# Patient Record
Sex: Female | Born: 1985 | Race: Black or African American | Hispanic: No | Marital: Single | Smoking: Former smoker
Health system: Southern US, Community
[De-identification: ages and names within clinical notes are randomized; demographics above are authoritative.]

## PROBLEM LIST (undated history)

## (undated) DIAGNOSIS — E669 Obesity, unspecified: Secondary | ICD-10-CM

---

## 2006-02-07 ENCOUNTER — Emergency Department (HOSPITAL_COMMUNITY): Admission: EM | Admit: 2006-02-07 | Discharge: 2006-02-07 | Payer: Self-pay | Admitting: Emergency Medicine

## 2006-07-15 ENCOUNTER — Emergency Department (HOSPITAL_COMMUNITY): Admission: EM | Admit: 2006-07-15 | Discharge: 2006-07-15 | Payer: Self-pay | Admitting: Emergency Medicine

## 2006-09-01 ENCOUNTER — Emergency Department (HOSPITAL_COMMUNITY): Admission: EM | Admit: 2006-09-01 | Discharge: 2006-09-01 | Payer: Self-pay | Admitting: Emergency Medicine

## 2007-01-08 ENCOUNTER — Emergency Department (HOSPITAL_COMMUNITY): Admission: EM | Admit: 2007-01-08 | Discharge: 2007-01-08 | Payer: Self-pay | Admitting: Emergency Medicine

## 2007-01-29 ENCOUNTER — Emergency Department (HOSPITAL_COMMUNITY): Admission: EM | Admit: 2007-01-29 | Discharge: 2007-01-30 | Payer: Self-pay | Admitting: Emergency Medicine

## 2007-04-13 HISTORY — PX: CHOLECYSTECTOMY: SHX55

## 2007-07-24 ENCOUNTER — Emergency Department (HOSPITAL_COMMUNITY): Admission: EM | Admit: 2007-07-24 | Discharge: 2007-07-24 | Payer: Self-pay | Admitting: Emergency Medicine

## 2007-07-25 ENCOUNTER — Emergency Department (HOSPITAL_COMMUNITY): Admission: EM | Admit: 2007-07-25 | Discharge: 2007-07-25 | Payer: Self-pay | Admitting: Emergency Medicine

## 2008-03-20 ENCOUNTER — Emergency Department (HOSPITAL_COMMUNITY): Admission: EM | Admit: 2008-03-20 | Discharge: 2008-03-20 | Payer: Self-pay | Admitting: Emergency Medicine

## 2008-03-23 ENCOUNTER — Observation Stay (HOSPITAL_COMMUNITY): Admission: EM | Admit: 2008-03-23 | Discharge: 2008-03-24 | Payer: Self-pay | Admitting: Emergency Medicine

## 2008-03-23 ENCOUNTER — Encounter (INDEPENDENT_AMBULATORY_CARE_PROVIDER_SITE_OTHER): Payer: Self-pay | Admitting: Surgery

## 2010-04-03 ENCOUNTER — Emergency Department (HOSPITAL_COMMUNITY)
Admission: EM | Admit: 2010-04-03 | Discharge: 2010-04-03 | Payer: Self-pay | Source: Home / Self Care | Admitting: Emergency Medicine

## 2010-04-17 ENCOUNTER — Emergency Department (HOSPITAL_COMMUNITY)
Admission: EM | Admit: 2010-04-17 | Discharge: 2010-04-17 | Payer: Self-pay | Source: Home / Self Care | Admitting: Family Medicine

## 2010-08-25 NOTE — H&P (Signed)
Diane Wong, Diane Wong               ACCOUNT NO.:  [id number]   MEDICAL RECORD NO.:  [id number]          PATIENT TYPE:  INP   LOCATION:  5128                         FACILITY:  [facility]   PHYSICIAN:  Wilmon Arms. Corliss Skains, M.D. DATE OF BIRTH:  1985/08/11   DATE OF ADMISSION:  03/23/2008  DATE OF DISCHARGE:                              HISTORY & PHYSICAL   CHIEF COMPLAINT:  Recurrent right upper quadrant pain.   HISTORY OF PRESENT ILLNESS:  The patient is a 25 year old female who  presents with a 3-week history of episodes of right upper quadrant pain  associated with nausea, vomiting, and diarrhea.  This tends to be a  postprandial.  She had a severe attack on March 20, 2008, and was  evaluated in the emergency department.  At that time, she was noted to  have a total bilirubin level of 1.9.  Her white count was not obtained  at that time.  An ultrasound at that time showed cholelithiasis, but no  sign of cholecystitis.  Common bile duct was normal in appearance.  She  was discharged and was given an appointment for evaluation with [facility].  However, the patient had recurrence of her symptoms  last night.  These persisted throughout the day, so she came to the  emergency department today for evaluation.   PAST MEDICAL HISTORY:  1. Asthma.  2. Allergic rhinitis.   PAST SURGICAL HISTORY:  None.   FAMILY HISTORY:  Noncontributory.   SOCIAL HISTORY:  Nonsmoker and nondrinker.   ALLERGIES:  No drug allergies.   MEDICATIONS:  Albuterol MDI   PHYSICAL EXAMINATION:  VITAL SIGNS:  Temperature 97.5, pulse 82,  respirations 18, and blood pressure 137/88.  GENERAL:  This is a well-developed and well-nourished female, in no  apparent distress.  HEENT:  EOMI.  Sclerae anicteric.  NECK:  No mass or thyromegaly.  LUNGS:  Clear.  Normal respiratory effort.  HEART:  Regular rate and rhythm.  No murmur.  ABDOMEN:  Positive bowel sounds, tender in the right upper quadrant.  No  rebound.  No masses.  EXTREMITIES:  No edema.  SKIN:  Warm and dry with no sign of jaundice.   LABORATORIES:  Urine pregnancy is negative.  Lipase is 24.  White count  5.2 and hemoglobin 13.1.  Total bili 0.7, alk phos 119, AST 40, and ALT  162.  The patient did not have an ultrasound today.   IMPRESSION:  1. Biliary colic.  2. Possible early acute cholecystitis.   PLAN:  We will admit for IV antibiotics, IV hydration, and laparoscopic  cholecystectomy this admission.      Wilmon Arms. Tsuei, M.D.  Electronically Signed     MKT/MEDQ  D:  03/23/2008  T:  03/24/2008  Job:  [id number]

## 2010-08-25 NOTE — Op Note (Signed)
NAMEGINELLE, BAYS               ACCOUNT NO.:  [id number]   MEDICAL RECORD NO.:  [id number]          PATIENT TYPE:  INP   LOCATION:  5128                         FACILITY:  [facility]   PHYSICIAN:  Wilmon Arms. Corliss Skains, M.D. DATE OF BIRTH:  03/17/1986   DATE OF PROCEDURE:  03/23/2008  DATE OF DISCHARGE:                               OPERATIVE REPORT   PREOPERATIVE DIAGNOSES:  1. Biliary colic.  2. Early acute appendicitis.   POSTOPERATIVE DIAGNOSES:  1. Biliary colic.  2. Early acute appendicitis.   PROCEDURE PERFORMED:  Laparoscopic cholecystectomy with intraoperative  cholangiogram.   SURGEON:  Wilmon Arms. Tsuei, MD   ANESTHESIA:  General endotracheal.   INDICATIONS:  The patient is a 25 year old female who presents with a 3-  week history of recurrent episodes of right upper quadrant pain, nausea,  vomiting, and diarrhea.  She was evaluated early in the week in the  emergency department, was noted to have a symptomatic cholelithiasis  with a mildly elevated bilirubin of 1.9.  The patient was discharged  home with a followup appointment with Surgery, but last night began  having more severe symptoms.  This persisted throughout the day, so she  presented to the emergency department for evaluation.  She was noted to  have a normal white count and her liver function tests were normal.  Due  to her recurrent symptoms, the decision was made to proceed to the  operating room for laparoscopic cholecystectomy.   DESCRIPTION OF PROCEDURE:  The patient was brought to the operating  room, placed in a supine position on the operating room table.  After  adequate level of general anesthesia was obtained, the patient's abdomen  was prepped with Betadine and draped in a sterile fashion.  A time-out  was taken to assure the proper patient, the proper procedure.  The area  below her umbilicus was infiltrated with Marcaine and a transverse  incision was made.  Dissection was carried down through a  considerable  amount of adipose tissue to the fascia.  The fascia was grasped with  Kocher clamps and opened vertically.  We bluntly dissected into the  peritoneal cavity.  A stay suture of 0 Vicryl was placed around the  fascial opening.  A Hasson cannula was inserted and secured with a stay  suture.  Pneumoperitoneum was obtained by insufflating CO2 and  maintaining maximal pressure of 15 mmHg.  The laparoscope was inserted,  and the patient was positioned in reverse Trendelenburg, tilted to left.  We switched to a 30-degree, 10-mm scope due to the patient's size.  The  liver edge was elevated and we identified the gallbladder.  This  appeared mildly inflamed but did not appear distended or particularly  thickened.  This gallbladder was grasped with a clamp and elevated.  We  then dissected a lot of omental adhesions away from the surface of the  gallbladder.  We exposed the hilum of the gallbladder.  I was able to  bluntly dissect around the cystic duct.  It was ligated with a clip  distally.  A small opening was created on the  cystic duct.  A Cook  cholangiogram catheter was brought through the stab incision and  threaded into the cystic duct.  It was secured with a clip.  A  cholangiogram was then obtained, which showed good flow proximally and  distally in biliary tree with no sign of filling defects or obstruction.  Contrast flowed quickly and easily into the duodenum.  The patient has a  very long cystic duct.  The catheter was removed, and the cystic duct  was ligated with 3 clips and divided.  The cystic artery was ligated  with clips and divided.  Cautery was then used to remove the gallbladder  from the liver bed.  The gallbladder was placed in EndoCatch sac and  removed through the umbilical port site.  We re-inspected the  gallbladder fossa for hemostasis.  We irrigated the right upper  quadrant, suctioned as much as possible.  Pneumoperitoneum was then  released, as we  removed the trocars.  The pursestring sutures were used  to close the umbilical fascia.  A 4-0 Monocryl was used to close the  skin incisions.  Steri-Strips and clean dressings were applied.  The  patient was extubated and brought to the recovery room in stable  condition.  All sponge, instrument, and needle counts were correct.      Wilmon Arms. Tsuei, M.D.  Electronically Signed     MKT/MEDQ  D:  03/23/2008  T:  03/24/2008  Job:  [id number]

## 2010-10-06 ENCOUNTER — Emergency Department (HOSPITAL_COMMUNITY)
Admission: EM | Admit: 2010-10-06 | Discharge: 2010-10-06 | Disposition: A | Discharge status: Home / Self Care | Payer: Self-pay | Attending: Emergency Medicine | Admitting: Emergency Medicine

## 2010-10-06 ENCOUNTER — Emergency Department (HOSPITAL_COMMUNITY): Payer: Self-pay

## 2010-10-06 DIAGNOSIS — R0789 Other chest pain: Secondary | ICD-10-CM | POA: Insufficient documentation

## 2010-10-06 DIAGNOSIS — J40 Bronchitis, not specified as acute or chronic: Secondary | ICD-10-CM | POA: Insufficient documentation

## 2010-10-06 DIAGNOSIS — J309 Allergic rhinitis, unspecified: Secondary | ICD-10-CM | POA: Insufficient documentation

## 2010-10-06 DIAGNOSIS — J45909 Unspecified asthma, uncomplicated: Secondary | ICD-10-CM | POA: Insufficient documentation

## 2010-10-06 DIAGNOSIS — R0602 Shortness of breath: Secondary | ICD-10-CM | POA: Insufficient documentation

## 2010-10-06 LAB — DIFFERENTIAL
Eosinophils Relative: 2 % (ref 0–5)
Lymphocytes Relative: 44 % (ref 12–46)
Lymphs Abs: 3.1 10*3/uL (ref 0.7–4.0)
Neutro Abs: 3.3 10*3/uL (ref 1.7–7.7)
Neutrophils Relative %: 46 % (ref 43–77)

## 2010-10-06 LAB — CBC
HCT: 36.6 % (ref 36.0–46.0)
Hemoglobin: 11.8 g/dL — ABNORMAL LOW (ref 12.0–15.0)
MCHC: 32.2 g/dL (ref 30.0–36.0)
MCV: 92.4 fL (ref 78.0–100.0)
Platelets: 282 10*3/uL (ref 150–400)
RBC: 3.96 MIL/uL (ref 3.87–5.11)
RDW: 13 % (ref 11.5–15.5)
WBC: 7.1 10*3/uL (ref 4.0–10.5)

## 2010-10-06 LAB — BASIC METABOLIC PANEL
Calcium: 9.4 mg/dL (ref 8.4–10.5)
GFR calc Af Amer: >60 mL/min (ref >60)
GFR calc non Af Amer: >60 mL/min (ref >60)
Potassium: 3.9 mEq/L (ref 3.5–5.1)
Sodium: 138 mEq/L (ref 135–145)

## 2010-10-06 LAB — D-DIMER, QUANTITATIVE: D-Dimer, Quant: 0.29 ug/mL-FEU (ref 0.00–0.48)

## 2010-11-22 ENCOUNTER — Inpatient Hospital Stay (INDEPENDENT_AMBULATORY_CARE_PROVIDER_SITE_OTHER)
Admission: RE | Admit: 2010-11-22 | Discharge: 2010-11-22 | Disposition: A | Discharge status: Home / Self Care | Payer: Self-pay | Source: Ambulatory Visit | Attending: Family Medicine | Admitting: Family Medicine

## 2010-11-22 DIAGNOSIS — R51 Headache: Secondary | ICD-10-CM

## 2010-11-22 DIAGNOSIS — J309 Allergic rhinitis, unspecified: Secondary | ICD-10-CM

## 2010-12-29 ENCOUNTER — Emergency Department (HOSPITAL_COMMUNITY)
Admission: EM | Admit: 2010-12-29 | Discharge: 2010-12-29 | Disposition: A | Discharge status: Home / Self Care | Payer: Self-pay | Attending: Emergency Medicine | Admitting: Emergency Medicine

## 2010-12-29 DIAGNOSIS — R21 Rash and other nonspecific skin eruption: Secondary | ICD-10-CM | POA: Insufficient documentation

## 2011-01-05 LAB — RAPID STREP SCREEN (MED CTR MEBANE ONLY): Streptococcus, Group A Screen (Direct): POSITIVE — AB

## 2011-01-15 LAB — COMPREHENSIVE METABOLIC PANEL
ALT: 162 U/L — ABNORMAL HIGH (ref 0–35)
ALT: 228 U/L — ABNORMAL HIGH (ref 0–35)
Albumin: 3.7 g/dL (ref 3.5–5.2)
Alkaline Phosphatase: 108 U/L (ref 39–117)
Alkaline Phosphatase: 119 U/L — ABNORMAL HIGH (ref 39–117)
BUN: 5 mg/dL — ABNORMAL LOW (ref 6–23)
CO2: 23 mEq/L (ref 19–32)
Calcium: 9 mg/dL (ref 8.4–10.5)
GFR calc non Af Amer: >60 mL/min (ref >60)
Glucose, Bld: 102 mg/dL — ABNORMAL HIGH (ref 70–99)
Potassium: 4.2 mEq/L (ref 3.5–5.1)
Sodium: 134 mEq/L — ABNORMAL LOW (ref 135–145)
Sodium: 139 mEq/L (ref 135–145)
Total Protein: 6.7 g/dL (ref 6.0–8.3)

## 2011-01-15 LAB — URINALYSIS, ROUTINE W REFLEX MICROSCOPIC
Glucose, UA: NEGATIVE mg/dL
Hgb urine dipstick: NEGATIVE
Hgb urine dipstick: NEGATIVE
Ketones, ur: 15 mg/dL — AB
Ketones, ur: NEGATIVE mg/dL
Protein, ur: NEGATIVE mg/dL
Specific Gravity, Urine: 1.025 (ref 1.005–1.030)
Urobilinogen, UA: 1 mg/dL (ref 0.0–1.0)

## 2011-01-15 LAB — CBC
Hemoglobin: 13.1 g/dL (ref 12.0–15.0)
Platelets: 317 10*3/uL (ref 150–400)
RDW: 13.6 % (ref 11.5–15.5)

## 2011-01-15 LAB — DIFFERENTIAL
Basophils Relative: 1 % (ref 0–1)
Eosinophils Absolute: 0.5 10*3/uL (ref 0.0–0.7)
Monocytes Absolute: 0.4 10*3/uL (ref 0.1–1.0)
Monocytes Relative: 7 % (ref 3–12)

## 2011-01-15 LAB — POCT PREGNANCY, URINE: Preg Test, Ur: NEGATIVE

## 2011-01-15 LAB — LIPASE, BLOOD: Lipase: 26 U/L (ref 11–59)

## 2011-01-20 LAB — BASIC METABOLIC PANEL
CO2: 27
Calcium: 8.7
Creatinine, Ser: 0.77
GFR calc Af Amer: >60
GFR calc non Af Amer: >60
Sodium: 135

## 2011-01-20 LAB — DIFFERENTIAL
Lymphocytes Relative: 34
Lymphs Abs: 2.7
Monocytes Absolute: 0.7
Monocytes Relative: 9
Neutro Abs: 4.2
Neutrophils Relative %: 54

## 2011-01-20 LAB — CBC
Hemoglobin: 12.4
MCHC: 33.9
RBC: 4.09
WBC: 7.9

## 2011-01-20 LAB — HEMOGLOBIN AND HEMATOCRIT, BLOOD
HCT: 35.6 — ABNORMAL LOW
Hemoglobin: 12.2

## 2011-01-20 LAB — PREGNANCY, URINE: Preg Test, Ur: NEGATIVE

## 2011-01-20 LAB — URINALYSIS, ROUTINE W REFLEX MICROSCOPIC
Bilirubin Urine: NEGATIVE
Nitrite: NEGATIVE
Specific Gravity, Urine: 1.022
pH: 7.5

## 2011-01-21 LAB — RAPID STREP SCREEN (MED CTR MEBANE ONLY): Streptococcus, Group A Screen (Direct): NEGATIVE

## 2011-05-04 ENCOUNTER — Encounter (HOSPITAL_COMMUNITY): Payer: Self-pay | Admitting: Emergency Medicine

## 2011-05-04 ENCOUNTER — Emergency Department (HOSPITAL_COMMUNITY)
Admission: EM | Admit: 2011-05-04 | Discharge: 2011-05-04 | Disposition: A | Discharge status: Home / Self Care | Payer: No Typology Code available for payment source | Attending: Emergency Medicine | Admitting: Emergency Medicine

## 2011-05-04 DIAGNOSIS — M79609 Pain in unspecified limb: Secondary | ICD-10-CM | POA: Insufficient documentation

## 2011-05-04 DIAGNOSIS — R51 Headache: Secondary | ICD-10-CM | POA: Insufficient documentation

## 2011-05-04 DIAGNOSIS — T1490XA Injury, unspecified, initial encounter: Secondary | ICD-10-CM | POA: Insufficient documentation

## 2011-05-04 MED ORDER — IBUPROFEN 800 MG PO TABS: 800.0000 mg | ORAL_TABLET | Freq: Three times a day (TID) | ORAL | Status: AC

## 2011-05-04 MED ORDER — DIAZEPAM 5 MG PO TABS: 5.0000 mg | ORAL_TABLET | Freq: Two times a day (BID) | ORAL | Status: AC
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
## 2011-05-06 NOTE — ED Provider Notes (Signed)
Medical screening examination/treatment/procedure(s) were performed by non-physician practitioner and as supervising physician I was immediately available for consultation/collaboration.   Forbes Cellar, MD 05/06/11 1318

## 2011-07-05 ENCOUNTER — Emergency Department (INDEPENDENT_AMBULATORY_CARE_PROVIDER_SITE_OTHER)
Admission: EM | Admit: 2011-07-05 | Discharge: 2011-07-05 | Disposition: A | Discharge status: Home / Self Care | Payer: Self-pay | Source: Home / Self Care | Attending: Emergency Medicine | Admitting: Emergency Medicine

## 2011-07-05 ENCOUNTER — Encounter (HOSPITAL_COMMUNITY): Payer: Self-pay

## 2011-07-05 DIAGNOSIS — J029 Acute pharyngitis, unspecified: Secondary | ICD-10-CM

## 2011-07-05 LAB — POCT RAPID STREP A: Streptococcus, Group A Screen (Direct): NEGATIVE

## 2011-07-05 MED ORDER — GUAIFENESIN-CODEINE 100-10 MG/5ML PO SYRP: 5.0000 mL | ORAL_SOLUTION | Freq: Three times a day (TID) | ORAL | Status: DC | PRN

## 2011-07-05 NOTE — Discharge Instructions (Signed)
Pharyngitis, Viral and Bacterial Pharyngitis is soreness (inflammation) or infection of the pharynx. It is also called a sore throat. CAUSES  Most sore throats are caused by viruses and are part of a cold. However, some sore throats are caused by strep and other bacteria. Sore throats can also be caused by post nasal drip from draining sinuses, allergies and sometimes from sleeping with an open mouth. Infectious sore throats can be spread from person to person by coughing, sneezing and sharing cups or eating utensils. TREATMENT  Sore throats that are viral usually last 3-4 days. Viral illness will get better without medications (antibiotics). Strep throat and other bacterial infections will usually begin to get better about 24-48 hours after you begin to take antibiotics. HOME CARE INSTRUCTIONS   If the caregiver feels there is a bacterial infection or if there is a positive strep test, they will prescribe an antibiotic. The full course of antibiotics must be taken. If the full course of antibiotic is not taken, you or your child may become ill again. If you or your child has strep throat and do not finish all of the medication, serious heart or kidney diseases may develop.   Drink enough water and fluids to keep your urine clear or pale yellow.   Only take over-the-counter or prescription medicines for pain, discomfort or fever as directed by your caregiver.   Get lots of rest.   Gargle with salt water ( tsp. of salt in a glass of water) as often as every 1-2 hours as you need for comfort.   Hard candies may soothe the throat if individual is not at risk for choking. Throat sprays or lozenges may also be used.  SEEK MEDICAL CARE IF:   Large, tender lumps in the neck develop.   A rash develops.   Green, yellow-brown or bloody sputum is coughed up.   Your baby is older than 3 months with a rectal temperature of 100.5 F (38.1 C) or higher for more than 1 day.  SEEK IMMEDIATE MEDICAL CARE  IF:   A stiff neck develops.   You or your child are drooling or unable to swallow liquids.   You or your child are vomiting, unable to keep medications or liquids down.   You or your child has severe pain, unrelieved with recommended medications.   You or your child are having difficulty breathing (not due to stuffy nose).   You or your child are unable to fully open your mouth.   You or your child develop redness, swelling, or severe pain anywhere on the neck.   You have a fever.   Your baby is older than 3 months with a rectal temperature of 102 F (38.9 C) or higher.   Your baby is 44 months old or younger with a rectal temperature of 100.4 F (38 C) or higher.  MAKE SURE YOU:   Understand these instructions.   Will watch your condition.   Will get help right away if you are not doing well or get worse.  Document Released: 03/29/2005 Document Revised: 03/18/2011 Document Reviewed: 06/26/2007 [facility] Patient Information 2012 [place], [place].Sore Throat Sore throats may be caused by bacteria and viruses. They may also be caused by:  Smoking.   Pollution.   Allergies.  If a sore throat is due to strep infection (a bacterial infection), you may need:  A throat swab.   A culture test to verify the strep infection.  You will need one of these:  An  antibiotic shot.   Oral medicine for a full 10 days.  Strep infection is very contagious. A doctor should check any close contacts who have a sore throat or fever. A sore throat caused by a virus infection will usually last only 3-4 days. Antibiotics will not treat a viral sore throat.  Infectious mononucleosis (a viral disease), however, can cause a sore throat that lasts for up to 3 weeks. Mononucleosis can be diagnosed with blood tests. You must have been sick for at least 1 week in order for the test to give accurate results. HOME CARE INSTRUCTIONS   To treat a sore throat, take mild pain medicine.   Increase your  fluids.   Eat a soft diet.   Do not smoke.   Gargling with warm water or salt water (1 tsp. salt in 8 oz. water) can be helpful.   Try throat sprays or lozenges or sucking on hard candy to ease the symptoms.  Call your doctor if your sore throat lasts longer than 1 week.  SEEK IMMEDIATE MEDICAL CARE IF:  You have difficulty breathing.   You have increased swelling in the throat.   You have pain so severe that you are unable to swallow fluids or your saliva.   You have a severe headache, a high fever, vomiting, or a red rash.  Document Released: 05/06/2004 Document Revised: 03/18/2011 Document Reviewed: 03/16/2007 [facility] Patient Information 2012 [place], [place].

## 2011-07-05 NOTE — ED Provider Notes (Signed)
History     CSN: [id number]  Arrival date & time 07/05/11  1216   First MD Initiated Contact with Patient 07/05/11 1340      Chief Complaint  Patient presents with  . Sore Throat    (Consider location/radiation/quality/duration/timing/severity/associated sxs/prior treatment) HPI Comments: Patient presents urgent care complaining of a sore throat, and mild cough with congestion with generalized body aches unaware of she has had fevers or not.  Patient is a 26 y.o. female presenting with pharyngitis. The history is provided by the patient.  Sore Throat This is a new problem. The current episode started more than 2 days ago. The problem occurs constantly. The problem has not changed since onset.Pertinent negatives include no chest pain and no shortness of breath. The symptoms are aggravated by nothing. The symptoms are relieved by nothing. She has tried acetaminophen and a cold compress for the symptoms. The treatment provided no relief.    Past Medical History  Diagnosis Date  . Asthma     Past Surgical History  Procedure Date  . Cholecystectomy     Family History  Problem Relation Age of Onset  . Hypertension Mother     History  Substance Use Topics  . Smoking status: Former Games developer  . Smokeless tobacco: Not on file  . Alcohol Use: Yes    OB History    Grav Para Term Preterm Abortions TAB SAB Ect Mult Living                  Review of Systems  Constitutional: Positive for chills and appetite change. Negative for fever.  HENT: Positive for congestion and sore throat. Negative for rhinorrhea and ear discharge.   Respiratory: Positive for cough. Negative for shortness of breath and stridor.   Cardiovascular: Negative for chest pain.  Gastrointestinal: Negative for abdominal distention.    Allergies  Review of patient's allergies indicates no known allergies.  Home Medications   Current Outpatient Rx  Name Route Sig Dispense Refill  . GUAIFENESIN-CODEINE  100-10 MG/5ML PO SYRP Oral Take 5 mLs by mouth 3 (three) times daily as needed for cough or congestion. 120 mL 0  . IBUPROFEN 200 MG PO TABS Oral Take 400 mg by mouth every 6 (six) hours as needed. For pain    . ADULT MULTIVITAMIN W/MINERALS CH Oral Take 1 tablet by mouth daily.      BP 138/81  Pulse 78  Temp(Src) 98 F (36.7 C) (Oral)  Resp 20  SpO2 98%  LMP 06/17/2011  Physical Exam  Nursing note and vitals reviewed. Constitutional: She appears well-developed and well-nourished. No distress.  HENT:  Head: Normocephalic.  Right Ear: Tympanic membrane normal.  Left Ear: Tympanic membrane normal.  Nose: Rhinorrhea present.  Mouth/Throat: Uvula is midline and mucous membranes are normal. Posterior oropharyngeal erythema present. No oropharyngeal exudate or tonsillar abscesses.  Eyes: Conjunctivae are normal. Left eye exhibits no discharge. No scleral icterus.  Neck: Neck supple. No tracheal deviation present.  Cardiovascular: Normal rate.   Pulmonary/Chest: Effort normal. No respiratory distress.  Abdominal: Soft.  Musculoskeletal: She exhibits no tenderness.  Lymphadenopathy:    She has no cervical adenopathy.  Skin: No rash noted. No erythema.    ED Course  Procedures (including critical care time)   Labs Reviewed  POCT RAPID STREP A ([facility]RG CARE ONLY)   No results found.   1. Pharyngitis       MDM   Patient with upper respiratory symptoms for 5 days. Normal lung  exam. Comfortable symptomatic management discussed otherwise return for the symptoms are worsening patient agree with plan of care and followup as necessary       Jimmie Molly, MD 07/05/11 1555

## 2011-07-05 NOTE — ED Notes (Signed)
C/o 5 day duration of ST, body aches

## 2011-07-07 ENCOUNTER — Emergency Department (INDEPENDENT_AMBULATORY_CARE_PROVIDER_SITE_OTHER)
Admission: EM | Admit: 2011-07-07 | Discharge: 2011-07-07 | Disposition: A | Discharge status: Home / Self Care | Payer: Self-pay | Source: Home / Self Care | Attending: Family Medicine | Admitting: Family Medicine

## 2011-07-07 ENCOUNTER — Encounter (HOSPITAL_COMMUNITY): Payer: Self-pay | Admitting: *Deleted

## 2011-07-07 DIAGNOSIS — J039 Acute tonsillitis, unspecified: Secondary | ICD-10-CM

## 2011-07-07 LAB — POCT PREGNANCY, URINE: Preg Test, Ur: NEGATIVE

## 2011-07-07 LAB — GLUCOSE, CAPILLARY: Glucose-Capillary: 79 mg/dL (ref 70–99)

## 2011-07-07 LAB — POCT RAPID STREP A: Streptococcus, Group A Screen (Direct): NEGATIVE

## 2011-07-07 MED ORDER — IBUPROFEN 600 MG PO TABS: 600.0000 mg | ORAL_TABLET | Freq: Three times a day (TID) | ORAL | Status: AC | PRN

## 2011-07-07 MED ORDER — PENICILLIN V POTASSIUM 500 MG PO TABS: 500.0000 mg | ORAL_TABLET | Freq: Three times a day (TID) | ORAL | Status: DC

## 2011-07-07 MED ORDER — PREDNISONE 20 MG PO TABS: ORAL_TABLET | ORAL | Status: DC

## 2011-07-07 NOTE — Discharge Instructions (Signed)
  Is very important top keep well hydrated. Take the prescribed medications as instructed. Take ibuprofen scheduled every 8 hours for the next 24-48 hours take with food and plenty of liquids as it can upset your stomach. Use nasal saline spray at least 3 times a day. (simply saline is over the counter) Return if worsening symptoms like new onset of fever, difficulty breathing or not keeping fluids down.

## 2011-07-07 NOTE — ED Notes (Cosign Needed)
Pt seen and treated 3/25 for same c/o cough improved remains with sorethroat and dizziness - taking cough med with codeine

## 2011-07-08 ENCOUNTER — Encounter (HOSPITAL_COMMUNITY): Payer: Self-pay

## 2011-07-08 ENCOUNTER — Emergency Department (HOSPITAL_COMMUNITY)
Admission: EM | Admit: 2011-07-08 | Discharge: 2011-07-08 | Disposition: A | Discharge status: Home / Self Care | Payer: Self-pay | Attending: Emergency Medicine | Admitting: Emergency Medicine

## 2011-07-08 DIAGNOSIS — H1130 Conjunctival hemorrhage, unspecified eye: Secondary | ICD-10-CM | POA: Insufficient documentation

## 2011-07-08 DIAGNOSIS — R1110 Vomiting, unspecified: Secondary | ICD-10-CM

## 2011-07-08 DIAGNOSIS — J020 Streptococcal pharyngitis: Secondary | ICD-10-CM | POA: Insufficient documentation

## 2011-07-08 LAB — POCT PREGNANCY, URINE: Preg Test, Ur: NEGATIVE

## 2011-07-08 LAB — STREP A DNA PROBE: Group A Strep Probe: NEGATIVE

## 2011-07-08 MED ORDER — DEXAMETHASONE SODIUM PHOSPHATE 10 MG/ML IJ SOLN
10.0000 mg | Freq: Once | INTRAMUSCULAR | Status: AC
  Administered 2011-07-08: 10 mg via INTRAMUSCULAR
  Filled 2011-07-08: qty 1

## 2011-07-08 MED ORDER — ONDANSETRON HCL 4 MG PO TABS: 4.0000 mg | ORAL_TABLET | Freq: Four times a day (QID) | ORAL | Status: AC

## 2011-07-08 MED ORDER — PENICILLIN G BENZATHINE 1200000 UNIT/2ML IM SUSP
1.2000 10*6.[IU] | Freq: Once | INTRAMUSCULAR | Status: AC
  Administered 2011-07-08: 1.2 10*6.[IU] via INTRAMUSCULAR
  Filled 2011-07-08: qty 2

## 2011-07-08 NOTE — ED Notes (Signed)
Pt complains of vomiting today, she also has eye pain, eye is red and she states that it is painful

## 2011-07-08 NOTE — ED Provider Notes (Signed)
Medical screening examination/treatment/procedure(s) were performed by non-physician practitioner and as supervising physician I was immediately available for consultation/collaboration.   Anguel Delapena A Shanasia Ibrahim, MD 07/08/11 2350 

## 2011-07-08 NOTE — Discharge Instructions (Signed)
Pharyngitis, Viral and Bacterial Pharyngitis is soreness (inflammation) or infection of the pharynx. It is also called a sore throat. CAUSES  Most sore throats are caused by viruses and are part of a cold. However, some sore throats are caused by strep and other bacteria. Sore throats can also be caused by post nasal drip from draining sinuses, allergies and sometimes from sleeping with an open mouth. Infectious sore throats can be spread from person to person by coughing, sneezing and sharing cups or eating utensils. TREATMENT  Sore throats that are viral usually last 3-4 days. Viral illness will get better without medications (antibiotics). Strep throat and other bacterial infections will usually begin to get better about 24-48 hours after you begin to take antibiotics. HOME CARE INSTRUCTIONS   If the caregiver feels there is a bacterial infection or if there is a positive strep test, they will prescribe an antibiotic. The full course of antibiotics must be taken. If the full course of antibiotic is not taken, you or your child may become ill again. If you or your child has strep throat and do not finish all of the medication, serious heart or kidney diseases may develop.   Drink enough water and fluids to keep your urine clear or pale yellow.   Only take over-the-counter or prescription medicines for pain, discomfort or fever as directed by your caregiver.   Get lots of rest.   Gargle with salt water ( tsp. of salt in a glass of water) as often as every 1-2 hours as you need for comfort.   Hard candies may soothe the throat if individual is not at risk for choking. Throat sprays or lozenges may also be used.  SEEK MEDICAL CARE IF:   Large, tender lumps in the neck develop.   A rash develops.   Green, yellow-brown or bloody sputum is coughed up.   Your baby is older than 3 months with a rectal temperature of 100.5 F (38.1 C) or higher for more than 1 day.  SEEK IMMEDIATE MEDICAL CARE  IF:   A stiff neck develops.   You or your child are drooling or unable to swallow liquids.   You or your child are vomiting, unable to keep medications or liquids down.   You or your child has severe pain, unrelieved with recommended medications.   You or your child are having difficulty breathing (not due to stuffy nose).   You or your child are unable to fully open your mouth.   You or your child develop redness, swelling, or severe pain anywhere on the neck.   You have a fever.   Your baby is older than 3 months with a rectal temperature of 102 F (38.9 C) or higher.   Your baby is 82 months old or younger with a rectal temperature of 100.4 F (38 C) or higher.  MAKE SURE YOU:   Understand these instructions.   Will watch your condition.   Will get help right away if you are not doing well or get worse.  Document Released: 03/29/2005 Document Revised: 03/18/2011 Document Reviewed: 06/26/2007 [facility] Patient Information 2012 [place], [place].Subconjunctival Hemorrhage Your exam shows you have a subconjunctival hemorrhage. This is a harmless collection of blood covering a portion of the white of the eye. This condition may be due to injury or to straining (lifting, sneezing, or coughing). Often, there is no known cause. Subconjunctival blood does not cause pain or vision problems. This condition needs no treatment. It will take 1  to 2 weeks for the blood to dissolve. If you take aspirin or Coumadin on a daily basis or if you have high blood pressure, you should check with your doctor about the need for further treatment. Please call your doctor if you have problems with your vision, pain around the eye, or any other concerns about your condition. Document Released: 05/06/2004 Document Revised: 03/18/2011 Document Reviewed: 02/24/2009 [facility] Patient Information 2012 [place], [place].

## 2011-07-08 NOTE — ED Notes (Signed)
Pt. Has been sick since last Thurs.  She initially had a sore throat and vomited x2 on Thurs. Then sore throat continued and she developed nasal congestion and cough.  She took zyrtec over the weekend but was no better on Monday.  On Mon. She had her first visit with urgent care and received Robitussin with codeine.  Congestion improved but throat still sore.  Yesterday she went back to urgent care and received prescriptions for prednisone and motrin and PCN VK.  Pt. Has vomited once today but pt. Says when she threw up it caused a hemorrage in her eye.

## 2011-07-08 NOTE — ED Provider Notes (Signed)
History     CSN: [id number]  Arrival date & time 07/08/11  2009   First MD Initiated Contact with Patient 07/08/11 2056      Chief Complaint  Patient presents with  . Emesis  . Eye Pain    (Consider location/radiation/quality/duration/timing/severity/associated sxs/prior treatment) HPI Comments: Patient here with one episode of vomiting after taking her antibiotic for sore throat - here with also scleral left eye redness after the coughing and one episode of vomiting - she denies fever, chills, reports continued sore throat - no further nausea or vomiting.  Patient is a 26 y.o. female presenting with vomiting and eye pain. The history is provided by the patient. No language interpreter was used.  Emesis  This is a new problem. The current episode started 1 to 2 hours ago. Episode frequency: once. The problem has been resolved. The emesis has an appearance of stomach contents. There has been no fever. Associated symptoms include cough and URI. Pertinent negatives include no abdominal pain, no arthralgias, no chills, no diarrhea, no fever, no headaches, no myalgias and no sweats.  Eye Pain Associated symptoms include coughing, a sore throat and vomiting. Pertinent negatives include no abdominal pain, arthralgias, chest pain, chills, fever, headaches or myalgias.    Past Medical History  Diagnosis Date  . Asthma     Past Surgical History  Procedure Date  . Cholecystectomy     Family History  Problem Relation Age of Onset  . Hypertension Mother     History  Substance Use Topics  . Smoking status: Former Games developer  . Smokeless tobacco: Not on file  . Alcohol Use: Yes    OB History    Grav Para Term Preterm Abortions TAB SAB Ect Mult Living                  Review of Systems  Constitutional: Negative for fever and chills.  HENT: Positive for sore throat.   Eyes: Positive for pain.  Respiratory: Positive for cough. Negative for shortness of breath and wheezing.     Cardiovascular: Negative for chest pain.  Gastrointestinal: Positive for vomiting. Negative for abdominal pain and diarrhea.  Genitourinary: Negative for dysuria.  Musculoskeletal: Negative for myalgias and arthralgias.  Neurological: Negative for headaches.  All other systems reviewed and are negative.    Allergies  Review of patient's allergies indicates no known allergies.  Home Medications   Current Outpatient Rx  Name Route Sig Dispense Refill  . IBUPROFEN 600 MG PO TABS Oral Take 1 tablet (600 mg total) by mouth every 8 (eight) hours as needed for pain or fever. 30 tablet 0  . PENICILLIN V POTASSIUM 500 MG PO TABS Oral Take 1 tablet (500 mg total) by mouth 3 (three) times daily. 30 tablet 0  . PREDNISONE 20 MG PO TABS  2 tabs po daily for 3 days 6 tablet no    BP 122/79  Pulse 79  Temp(Src) 98 F (36.7 C) (Oral)  Resp 23  SpO2 100%  LMP 06/17/2011  Physical Exam  Nursing note and vitals reviewed. Constitutional: She is oriented to person, place, and time. She appears well-developed and well-nourished. No distress.  HENT:  Head: Normocephalic and atraumatic.  Right Ear: External ear normal.  Left Ear: External ear normal.  Nose: Nose normal.  Mouth/Throat: Oropharyngeal exudate present.  Eyes: Pupils are equal, round, and reactive to light. No scleral icterus.       Left medial subconjunctival hemorrhage  Neck: Normal range of motion.  Neck supple.       Anterior cervical adenopathy  Cardiovascular: Normal rate, regular rhythm and normal heart sounds.  Exam reveals no gallop and no friction rub.   No murmur heard. Pulmonary/Chest: Effort normal and breath sounds normal. No respiratory distress. She has no wheezes. She has no rales. She exhibits tenderness.       Mild ttp to left lateral chest wall  Abdominal: Soft. Bowel sounds are normal. She exhibits no distension and no mass. There is no tenderness. There is no rebound and no guarding.  Musculoskeletal: Normal  range of motion. She exhibits no edema and no tenderness.  Lymphadenopathy:    She has cervical adenopathy.  Neurological: She is alert and oriented to person, place, and time. No cranial nerve deficit.  Skin: Skin is warm and dry. No rash noted. No erythema. No pallor.  Psychiatric: She has a normal mood and affect. Her behavior is normal. Judgment and thought content normal.    ED Course  Procedures (including critical care time)   Labs Reviewed  POCT PREGNANCY, URINE   No results found.   Strep pharyngitis Vomiting Left subconjunctival hemorrhage.    MDM  Patient with Centor score of 4 - will treat for strep with injection of bicillin LA, as the PVK seems to have upset her stomach, she is non-tender and there is no further vomiting after oral trial of food and fluids.        Izola Price [place], [place] 07/08/11 2241

## 2011-07-13 NOTE — ED Provider Notes (Signed)
History     CSN: [id number]  Arrival date & time 07/07/11  1738   First MD Initiated Contact with Patient 07/07/11 1841      Chief Complaint  Patient presents with  . Sore Throat  . Dizziness  . Generalized Body Aches    (Consider location/radiation/quality/duration/timing/severity/associated sxs/prior treatment) HPI Comments: 26 y/o female, obese, former smoker with h/o asthma here c/o sore throat, dizziness and body aches. Was seen 2 days ago for sore throat with negative strep treated symptomatically. Remains afebrile but symptoms are not improving, reports she is able to swallow with minimal discomfort. Taking cough medicine with codeine. no history of diabetes. No vomiting or diarrhea. No chest pain, wheezing or shortness of breath.    Past Medical History  Diagnosis Date  . Asthma     Past Surgical History  Procedure Date  . Cholecystectomy     Family History  Problem Relation Age of Onset  . Hypertension Mother     History  Substance Use Topics  . Smoking status: Former Games developer  . Smokeless tobacco: Not on file  . Alcohol Use: Yes    OB History    Grav Para Term Preterm Abortions TAB SAB Ect Mult Living                  Review of Systems  Constitutional: Positive for appetite change and fatigue. Negative for fever and chills.  HENT: Positive for congestion and sore throat. Negative for ear pain, facial swelling, drooling, neck pain and neck stiffness.   Eyes: Negative for discharge and redness.  Respiratory: Positive for cough. Negative for shortness of breath and wheezing.   Gastrointestinal: Negative for nausea, vomiting, abdominal pain and diarrhea.  Musculoskeletal: Positive for myalgias.  Skin: Negative for rash.  Neurological: Positive for dizziness.    Allergies  Review of patient's allergies indicates no known allergies.  Home Medications   Current Outpatient Rx  Name Route Sig Dispense Refill  . IBUPROFEN 600 MG PO TABS Oral Take 1  tablet (600 mg total) by mouth every 8 (eight) hours as needed for pain or fever. 30 tablet 0  . ONDANSETRON HCL 4 MG PO TABS Oral Take 1 tablet (4 mg total) by mouth every 6 (six) hours. 12 tablet 0    BP 107/76  Pulse 121  Temp(Src) 98.8 F (37.1 C) (Oral)  Resp 21  SpO2 97%  LMP 06/17/2011  Physical Exam  Nursing note and vitals reviewed. Constitutional: She is oriented to person, place, and time. She appears well-developed and well-nourished. No distress.  HENT:       Nasal Congestion with erythema and swelling of nasal turbinates, clear rhinorrhea. Significant tonsillar and pharyngeal erythema. tonsils moderately enlarged bilateral, no exudates. No uvula deviation. No trismus. TM's normal.  Eyes: Conjunctivae and EOM are normal. Pupils are equal, round, and reactive to light. Right eye exhibits no discharge. Left eye exhibits no discharge.  Neck: Neck supple. No JVD present.  Cardiovascular: Normal rate, regular rhythm and normal heart sounds.   Pulmonary/Chest: Breath sounds normal. No respiratory distress. She has no wheezes. She has no rales. She exhibits no tenderness.  Abdominal: Soft. She exhibits no distension. There is no tenderness.       No HSM  Lymphadenopathy:    She has no cervical adenopathy.  Neurological: She is alert and oriented to person, place, and time.  Skin: No rash noted.    ED Course  Procedures (including critical care time)   Labs Reviewed  POCT RAPID STREP A ([facility] CARE ONLY)  STREP A DNA PROBE  POCT INFECTIOUS MONO SCREEN  GLUCOSE, CAPILLARY  POCT PREGNANCY, URINE  LAB REPORT - SCANNED   No results found.   1. Tonsillitis       MDM  Negative monoscreen, negative rapid strep, negative pregnancy test and normal glucose. Throat DNA pending. Treated with penicillin V, prednisone for 2 days and ibuprofen. Asked to return if worsening symptoms despite following treatment.        Sharin Grave, MD 07/13/11 [phone]

## 2011-07-16 ENCOUNTER — Emergency Department (HOSPITAL_COMMUNITY)
Admission: EM | Admit: 2011-07-16 | Discharge: 2011-07-16 | Disposition: A | Discharge status: Home / Self Care | Payer: Self-pay | Attending: Emergency Medicine | Admitting: Emergency Medicine

## 2011-07-16 DIAGNOSIS — Z87891 Personal history of nicotine dependence: Secondary | ICD-10-CM | POA: Insufficient documentation

## 2011-07-16 DIAGNOSIS — J45909 Unspecified asthma, uncomplicated: Secondary | ICD-10-CM | POA: Insufficient documentation

## 2011-07-16 DIAGNOSIS — G43909 Migraine, unspecified, not intractable, without status migrainosus: Secondary | ICD-10-CM | POA: Insufficient documentation

## 2011-07-16 DIAGNOSIS — H1130 Conjunctival hemorrhage, unspecified eye: Secondary | ICD-10-CM | POA: Insufficient documentation

## 2011-07-16 MED ORDER — OXYCODONE-ACETAMINOPHEN 5-325 MG PO TABS: 1.0000 | ORAL_TABLET | ORAL | Status: AC | PRN

## 2011-07-16 NOTE — Discharge Instructions (Signed)
Headaches, Frequently Asked Questions MIGRAINE HEADACHES Q: What is migraine? What causes it? How can I treat it? A: Generally, migraine headaches begin as a dull ache. Then they develop into a constant, throbbing, and pulsating pain. You may experience pain at the temples. You may experience pain at the front or back of one or both sides of the head. The pain is usually accompanied by a combination of:  Nausea.   Vomiting.   Sensitivity to light and noise.  Some people (about 15%) experience an aura (see below) before an attack. The cause of migraine is believed to be chemical reactions in the brain. Treatment for migraine may include over-the-counter or prescription medications. It may also include self-help techniques. These include relaxation training and biofeedback.  Q: What is an aura? A: About 15% of people with migraine get an "aura". This is a sign of neurological symptoms that occur before a migraine headache. You may see wavy or jagged lines, dots, or flashing lights. You might experience tunnel vision or blind spots in one or both eyes. The aura can include visual or auditory hallucinations (something imagined). It may include disruptions in smell (such as strange odors), taste or touch. Other symptoms include:  Numbness.   A "pins and needles" sensation.   Difficulty in recalling or speaking the correct word.  These neurological events may last as long as 60 minutes. These symptoms will fade as the headache begins. Q: What is a trigger? A: Certain physical or environmental factors can lead to or "trigger" a migraine. These include:  Foods.   Hormonal changes.   Weather.   Stress.  It is important to remember that triggers are different for everyone. To help prevent migraine attacks, you need to figure out which triggers affect you. Keep a headache diary. This is a good way to track triggers. The diary will help you talk to your healthcare professional about your  condition. Q: Does weather affect migraines? A: Bright sunshine, hot, humid conditions, and drastic changes in barometric pressure may lead to, or "trigger," a migraine attack in some people. But studies have shown that weather does not act as a trigger for everyone with migraines. Q: What is the link between migraine and hormones? A: Hormones start and regulate many of your body's functions. Hormones keep your body in balance within a constantly changing environment. The levels of hormones in your body are unbalanced at times. Examples are during menstruation, pregnancy, or menopause. That can lead to a migraine attack. In fact, about three quarters of all women with migraine report that their attacks are related to the menstrual cycle.  Q: Is there an increased risk of stroke for migraine sufferers? A: The likelihood of a migraine attack causing a stroke is very remote. That is not to say that migraine sufferers cannot have a stroke associated with their migraines. In persons under age 40, the most common associated factor for stroke is migraine headache. But over the course of a person's normal life span, the occurrence of migraine headache may actually be associated with a reduced risk of dying from cerebrovascular disease due to stroke.  Q: What are acute medications for migraine? A: Acute medications are used to treat the pain of the headache after it has started. Examples over-the-counter medications, NSAIDs, ergots, and triptans.  Q: What are the triptans? A: Triptans are the newest class of abortive medications. They are specifically targeted to treat migraine. Triptans are vasoconstrictors. They moderate some chemical reactions in the brain.   The triptans work on receptors in your brain. Triptans help to restore the balance of a neurotransmitter called serotonin. Fluctuations in levels of serotonin are thought to be a main cause of migraine.  Q: Are over-the-counter medications for migraine  effective? A: Over-the-counter, or "OTC," medications may be effective in relieving mild to moderate pain and associated symptoms of migraine. But you should see your caregiver before beginning any treatment regimen for migraine.  Q: What are preventive medications for migraine? A: Preventive medications for migraine are sometimes referred to as "prophylactic" treatments. They are used to reduce the frequency, severity, and length of migraine attacks. Examples of preventive medications include antiepileptic medications, antidepressants, beta-blockers, calcium channel blockers, and NSAIDs (nonsteroidal anti-inflammatory drugs). Q: Why are anticonvulsants used to treat migraine? A: During the past few years, there has been an increased interest in antiepileptic drugs for the prevention of migraine. They are sometimes referred to as "anticonvulsants". Both epilepsy and migraine may be caused by similar reactions in the brain.  Q: Why are antidepressants used to treat migraine? A: Antidepressants are typically used to treat people with depression. They may reduce migraine frequency by regulating chemical levels, such as serotonin, in the brain.  Q: What alternative therapies are used to treat migraine? A: The term "alternative therapies" is often used to describe treatments considered outside the scope of conventional Western medicine. Examples of alternative therapy include acupuncture, acupressure, and yoga. Another common alternative treatment is herbal therapy. Some herbs are believed to relieve headache pain. Always discuss alternative therapies with your caregiver before proceeding. Some herbal products contain arsenic and other toxins. TENSION HEADACHES Q: What is a tension-type headache? What causes it? How can I treat it? A: Tension-type headaches occur randomly. They are often the result of temporary stress, anxiety, fatigue, or anger. Symptoms include soreness in your temples, a tightening  band-like sensation around your head (a "vice-like" ache). Symptoms can also include a pulling feeling, pressure sensations, and contracting head and neck muscles. The headache begins in your forehead, temples, or the back of your head and neck. Treatment for tension-type headache may include over-the-counter or prescription medications. Treatment may also include self-help techniques such as relaxation training and biofeedback. CLUSTER HEADACHES Q: What is a cluster headache? What causes it? How can I treat it? A: Cluster headache gets its name because the attacks come in groups. The pain arrives with little, if any, warning. It is usually on one side of the head. A tearing or bloodshot eye and a runny nose on the same side of the headache may also accompany the pain. Cluster headaches are believed to be caused by chemical reactions in the brain. They have been described as the most severe and intense of any headache type. Treatment for cluster headache includes prescription medication and oxygen. SINUS HEADACHES Q: What is a sinus headache? What causes it? How can I treat it? A: When a cavity in the bones of the face and skull (a sinus) becomes inflamed, the inflammation will cause localized pain. This condition is usually the result of an allergic reaction, a tumor, or an infection. If your headache is caused by a sinus blockage, such as an infection, you will probably have a fever. An x-ray will confirm a sinus blockage. Your caregiver's treatment might include antibiotics for the infection, as well as antihistamines or decongestants.  REBOUND HEADACHES Q: What is a rebound headache? What causes it? How can I treat it? A: A pattern of taking acute headache medications too   often can lead to a condition known as "rebound headache." A pattern of taking too much headache medication includes taking it more than 2 days per week or in excessive amounts. That means more than the label or a caregiver advises.  With rebound headaches, your medications not only stop relieving pain, they actually begin to cause headaches. Doctors treat rebound headache by tapering the medication that is being overused. Sometimes your caregiver will gradually substitute a different type of treatment or medication. Stopping may be a challenge. Regularly overusing a medication increases the potential for serious side effects. Consult a caregiver if you regularly use headache medications more than 2 days per week or more than the label advises. ADDITIONAL QUESTIONS AND ANSWERS Q: What is biofeedback? A: Biofeedback is a self-help treatment. Biofeedback uses special equipment to monitor your body's involuntary physical responses. Biofeedback monitors:  Breathing.   Pulse.   Heart rate.   Temperature.   Muscle tension.   Brain activity.  Biofeedback helps you refine and perfect your relaxation exercises. You learn to control the physical responses that are related to stress. Once the technique has been mastered, you do not need the equipment any more. Q: Are headaches hereditary? A: Four out of five (80%) of people that suffer report a family history of migraine. Scientists are not sure if this is genetic or a family predisposition. Despite the uncertainty, a child has a 50% chance of having migraine if one parent suffers. The child has a 75% chance if both parents suffer.  Q: Can children get headaches? A: By the time they reach high school, most young people have experienced some type of headache. Many safe and effective approaches or medications can prevent a headache from occurring or stop it after it has begun.  Q: What type of doctor should I see to diagnose and treat my headache? A: Start with your primary caregiver. Discuss his or her experience and approach to headaches. Discuss methods of classification, diagnosis, and treatment. Your caregiver may decide to recommend you to a headache specialist, depending upon  your symptoms or other physical conditions. Having diabetes, allergies, etc., may require a more comprehensive and inclusive approach to your headache. The National Head[employer] Foundation will provide, upon request, a list of [facility] physician members in your state. Document Released: 06/19/2003 Document Revised: 03/18/2011 Document Reviewed: 11/27/2007 [facility] Patient Information 2012 [place], [place].Migraine Headache A migraine headache is an intense, throbbing pain on one or both sides of your head. The exact cause of a migraine headache is not always known. A migraine may be caused when nerves in the brain become irritated and release chemicals that cause swelling within blood vessels, causing pain. Many migraine sufferers have a family history of migraines. Before you get a migraine you may or may not get an aura. An aura is a group of symptoms that can predict the beginning of a migraine. An aura may include:  Visual changes such as:   Flashing lights.   Bright spots or zig-zag lines.   Tunnel vision.   Feelings of numbness.   Trouble talking.   Muscle weakness.  SYMPTOMS  Pain on one or both sides of your head.   Pain that is pulsating or throbbing in nature.   Pain that is severe enough to prevent daily activities.   Pain that is aggravated by any daily physical activity.   Nausea (feeling sick to your stomach), vomiting, or both.   Pain with exposure to bright lights, loud noises, or activity.  General sensitivity to bright lights or loud noises.  MIGRAINE TRIGGERS Examples of triggers of migraine headaches include:   Alcohol.   Smoking.   Stress.   It may be related to menses (female menstruation).   Aged cheeses.   Foods or drinks that contain nitrates, glutamate, aspartame, or tyramine.   Lack of sleep.   Chocolate.   Caffeine.   Hunger.   Medications such as nitroglycerine (used to treat chest pain), birth control pills, estrogen, and some blood  pressure medications.  DIAGNOSIS  A migraine headache is often diagnosed based on:  Symptoms.   Physical examination.   A computerized X-ray scan (computed tomography, CT) of your head.  TREATMENT  Medications can help prevent migraines if they are recurrent or should they become recurrent. Your caregiver can help you with a medication or treatment program that will be helpful to you.   Lying down in a dark, quiet room may be helpful.   Keeping a headache diary may help you find a trend as to what may be triggering your headaches.  SEEK IMMEDIATE MEDICAL CARE IF:   You have confusion, personality changes or seizures.   You have headaches that wake you from sleep.   You have an increased frequency in your headaches.   You have a stiff neck.   You have a loss of vision.   You have muscle weakness.   You start losing your balance or have trouble walking.   You feel faint or pass out.  MAKE SURE YOU:   Understand these instructions.   Will watch your condition.   Will get help right away if you are not doing well or get worse.  Document Released: 03/29/2005 Document Revised: 03/18/2011 Document Reviewed: 11/12/2008 [facility] Patient Information 2012 [place], [place].Subconjunctival Hemorrhage A subconjunctival hemorrhage is a bright red patch covering a portion of the white of the eye. The white part of the eye is called the sclera, and it is covered by a thin membrane called the conjunctiva. This membrane is clear, except for tiny blood vessels that you can see with the naked eye. When your eye is irritated or inflamed and becomes red, it is because the vessels in the conjunctiva are swollen. Sometimes, a blood vessel in the conjunctiva can break and bleed. When this occurs, the blood builds up between the conjunctiva and the sclera, and spreads out to create a red area. The red spot may be very small at first. It may then spread to cover a larger part of the surface of the eye,  or even all of the visible white part of the eye. In almost all cases, the blood will go away and the eye will become white again. Before completely dissolving, however, the red area may spread. It may also become brownish-yellow in color, before going away. If a lot of blood collects under the conjunctiva, it may look like a bulge on the surface of the eye. This looks scary, but it will also eventually flatten out and go away. Subconjunctival hemorrhages do not cause pain, but if swollen, may cause a feeling of irritation. There is no effect on vision.  CAUSES   The most common cause is mild trauma (rubbing the eye, irritation).   Subconjunctival hemorrhages can happen because of coughing or straining (lifting heavy objects), vomiting, or sneezing.   In some cases, your doctor may want to check your blood pressure. High blood pressure can also cause a sunconjunctival hemorrhage.   Severe trauma or blunt  injuries.   Diseases that affect blood clotting (hemophilia, leukemia).   Abnormalities of blood vessels behind the eye (carotid cavernous sinus fistula).   Tumors behind the eye.   Certain drugs (aspirin, coumadin, heparin).   Recent eye surgery.  HOME CARE INSTRUCTIONS   Do not worry about the appearance of your eye. You may continue your usual activities.   Often, follow-up is not necessary.  SEEK MEDICAL CARE IF:   Your eye becomes painful.   The bleeding does not disappear within 3 weeks.   Bleeding occurs elsewhere, for example, under the skin, in the mouth, or in the other eye.   You have recurring subconjunctival hemorrhages.  SEEK IMMEDIATE MEDICAL CARE IF:   Your vision changes or you have difficulty seeing.   You develop severe headache, persistent vomiting, confusion, or abnormal drowsiness (lethargy).   Your eye seems to bulge or protrude from the eye socket.   You notice the sudden appearance of bruises, or have spontaneous bleeding elsewhere on your body.   Document Released: 03/29/2005 Document Revised: 03/18/2011 Document Reviewed: 02/24/2009 [facility] Patient Information 2012 [place], [place].

## 2011-07-16 NOTE — ED Notes (Signed)
PA in prior to RN assessment. Pt has blood in left eye after vomiting.

## 2011-07-16 NOTE — ED Provider Notes (Signed)
History     CSN: [id number]  Arrival date & time 07/16/11  1424   First MD Initiated Contact with Patient 07/16/11 1428      Chief Complaint  Patient presents with  . Eye Problem    (Consider location/radiation/quality/duration/timing/severity/associated sxs/prior treatment) HPI She presents to the emergency department with complaints of headache and eye pain. Patient states that she received a subconjunctival hemorrhage to her left eye after an episode of vomiting one week ago. She has been seen here in the ED for this problem and has seen ophthalmologist for this problem as well. Both of whom have said that everything is fine. Comes to the ED concerned about some headaches that she's been having with the eye pain. She was not sure if the eye pain was causing the headaches. Patient does have a history of migraines and stated that these do feel the same except for that her eyes have been bothering her as well. She denies difficulty seeing or change in vision. She denies weakness, fatigue, chills, nausea, vomiting, difficulty with balance. Patient is not having a headache at this time and declined pain medicine  Past Medical History  Diagnosis Date  . Asthma     Past Surgical History  Procedure Date  . Cholecystectomy     Family History  Problem Relation Age of Onset  . Hypertension Mother     History  Substance Use Topics  . Smoking status: Former Games developer  . Smokeless tobacco: Not on file  . Alcohol Use: Yes    OB History    Grav Para Term Preterm Abortions TAB SAB Ect Mult Living                  Review of Systems  Allergies  Review of patient's allergies indicates no known allergies.  Home Medications   Current Outpatient Rx  Name Route Sig Dispense Refill  . ACETAMINOPHEN 500 MG PO TABS Oral Take 1,000 mg by mouth every 6 (six) hours as needed. pain    . IBUPROFEN 600 MG PO TABS Oral Take 1 tablet (600 mg total) by mouth every 8 (eight) hours as needed for  pain or fever. 30 tablet 0  . ONDANSETRON HCL 4 MG PO TABS Oral Take 1 tablet (4 mg total) by mouth every 6 (six) hours. 12 tablet 0  . OXYCODONE-ACETAMINOPHEN 5-325 MG PO TABS Oral Take 1 tablet by mouth every 4 (four) hours as needed for pain. 10 tablet 0    BP 105/72  Pulse 90  Temp(Src) 98.6 F (37 C) (Oral)  Resp 20  SpO2 97%  LMP 06/17/2011  Physical Exam  Nursing note and vitals reviewed. Constitutional: She is oriented to person, place, and time. She appears well-developed and well-nourished. No distress.  HENT:  Head: Normocephalic and atraumatic.  Eyes: Pupils are equal, round, and reactive to light. Right conjunctiva is not injected. Right conjunctiva has no hemorrhage. Left conjunctiva is not injected. Left conjunctiva has a hemorrhage.         Pts L eye shows subconjunctival hemorrhage. Pt does not have consensual photophobia. Pupils are PERRL. Pts EOMIs are intact without eye pain.  Neck: Normal range of motion. Neck supple.  Cardiovascular: Normal rate and regular rhythm.   Pulmonary/Chest: Effort normal.  Abdominal: Soft.  Neurological: She is oriented to person, place, and time. She has normal strength. No cranial nerve deficit or sensory deficit. She displays a negative Romberg sign.  Skin: Skin is warm and dry.  ED Course  Procedures (including critical care time)  Labs Reviewed - No data to display No results found.   1. Subconjunctival hemorrhage, non-traumatic   2. Migraine       MDM  Patient's subconjunctival hemorrhage appears to be resolving after reviewing previous notes. The believe that her headaches are being caused by her migraines which she has a history of. I'm not concerned about acute eye pathology as the symptoms have been persisting for one week she has seen Dr. Burgess Estelle, the ophthalmologist, for these same symptoms.  She states that the Excedrin if not working to completely resolve her headache. We'll give her a short course of  Percocet to help with her pain. And also per patient request a referral to a different ophthalmologist.  Pt has been advised of the symptoms that warrant their return to the ED. Patient has voiced understanding and has agreed to follow-up with the PCP or specialist.         Dorthula Matas, PA 07/16/11 1523

## 2011-07-16 NOTE — ED Provider Notes (Signed)
Medical screening examination/treatment/procedure(s) were performed by non-physician practitioner and as supervising physician I was immediately available for consultation/collaboration.   Lyanne Co, MD 07/16/11 [phone]

## 2011-08-04 ENCOUNTER — Emergency Department (HOSPITAL_COMMUNITY)
Admission: EM | Admit: 2011-08-04 | Discharge: 2011-08-05 | Disposition: A | Discharge status: Home / Self Care | Payer: Self-pay | Attending: Emergency Medicine | Admitting: Emergency Medicine

## 2011-08-04 ENCOUNTER — Encounter (HOSPITAL_COMMUNITY): Payer: Self-pay | Admitting: Family Medicine

## 2011-08-04 DIAGNOSIS — J45909 Unspecified asthma, uncomplicated: Secondary | ICD-10-CM | POA: Insufficient documentation

## 2011-08-04 DIAGNOSIS — R51 Headache: Secondary | ICD-10-CM | POA: Insufficient documentation

## 2011-08-04 MED ORDER — METOCLOPRAMIDE HCL 5 MG/ML IJ SOLN
10.0000 mg | Freq: Once | INTRAMUSCULAR | Status: AC
  Administered 2011-08-04: 10 mg via INTRAVENOUS
  Filled 2011-08-04: qty 2

## 2011-08-04 MED ORDER — DIPHENHYDRAMINE HCL 50 MG/ML IJ SOLN
25.0000 mg | Freq: Once | INTRAMUSCULAR | Status: AC
  Administered 2011-08-04: 25 mg via INTRAVENOUS
  Filled 2011-08-04: qty 1

## 2011-08-04 MED ORDER — DEXAMETHASONE SODIUM PHOSPHATE 10 MG/ML IJ SOLN
10.0000 mg | Freq: Once | INTRAMUSCULAR | Status: AC
  Administered 2011-08-04: 10 mg via INTRAVENOUS
  Filled 2011-08-04: qty 1

## 2011-08-04 MED ORDER — KETOROLAC TROMETHAMINE 30 MG/ML IJ SOLN
30.0000 mg | Freq: Once | INTRAMUSCULAR | Status: AC
  Administered 2011-08-04: 30 mg via INTRAVENOUS
  Filled 2011-08-04: qty 1

## 2011-08-04 NOTE — ED Provider Notes (Signed)
History     CSN: [id number]  Arrival date & time 08/04/11  2100   First MD Initiated Contact with Patient 08/04/11 2216      Chief Complaint  Patient presents with  . Headache    HPI  History provided by the patient. Patient is a 26 year old female with history of asthma presents with complaints of persistent waxing and waning headache for the past one month. Patient does report she has had multiple visits to the emergency room but not always necessarily for complaints of headache. Headache is primary located on the right side of head and posteriorly. Headache is sometimes worse with bright lights and loud noises. Patient also feels worsening pain symptoms after sitting at her computer screen at work. Patient has used some over-the-counter pain medications including Tylenol and Excedrin with occasional relief. Patient denies any visual problems patients not wear any corrective lenses. She denies any eye pain. Patient denies any numbness or weakness in extremities. Patient has any confusion or difficulty with speech. Patient denies any recent URI symptoms, no cough, nasal congestion or sinus pressure. Patient denies any fever, chills, sweats or neck stiffness.    Past Medical History  Diagnosis Date  . Asthma     Past Surgical History  Procedure Date  . Cholecystectomy     Family History  Problem Relation Age of Onset  . Hypertension Mother     History  Substance Use Topics  . Smoking status: Former Smoker    Types: Cigars  . Smokeless tobacco: Not on file  . Alcohol Use: Yes     Occasional     OB History    Grav Para Term Preterm Abortions TAB SAB Ect Mult Living                  Review of Systems  Constitutional: Negative for fever and chills.  HENT: Negative for neck pain and neck stiffness.   Eyes: Positive for photophobia. Negative for pain and visual disturbance.  Gastrointestinal: Negative for nausea and vomiting.  Neurological: Positive for headaches.  Negative for dizziness, speech difficulty, weakness, light-headedness and numbness.    Allergies  Review of patient's allergies indicates no known allergies.  Home Medications   Current Outpatient Rx  Name Route Sig Dispense Refill  . ACETAMINOPHEN 500 MG PO TABS Oral Take 1,000 mg by mouth every 6 (six) hours as needed. pain    . CETIRIZINE HCL 10 MG PO TABS Oral Take 10 mg by mouth daily.    . IBUPROFEN 400 MG PO TABS Oral Take 400 mg by mouth every 6 (six) hours as needed.    . OXYCODONE-ACETAMINOPHEN 5-325 MG PO TABS Oral Take 1 tablet by mouth every 4 (four) hours as needed. For pain releif    . POLYVINYL ALCOHOL 1.4 % OP SOLN Left Eye Place 1 drop into the left eye as needed. For hemorrhage      BP 126/76  Pulse 93  Temp(Src) 99.1 F (37.3 C) (Oral)  Resp 20  Wt 347 lb (157.398 kg)  SpO2 100%  LMP 08/04/2011  Physical Exam  Nursing note and vitals reviewed. Constitutional: She is oriented to person, place, and time. She appears well-developed and well-nourished. No distress.       Obese.  HENT:  Head: Normocephalic.  Eyes: Conjunctivae and EOM are normal. Pupils are equal, round, and reactive to light.  Fundoscopic exam:      The right eye shows no hemorrhage and no papilledema.  The left eye shows no hemorrhage and no papilledema.  Neck: Normal range of motion. Neck supple. No tracheal deviation present. No thyromegaly present.  Cardiovascular: Normal rate and regular rhythm.   Pulmonary/Chest: Effort normal and breath sounds normal. No respiratory distress. She has no wheezes. She has no rales.  Abdominal: Soft. She exhibits no distension. There is no tenderness.  Neurological: She is alert and oriented to person, place, and time. She has normal strength. No cranial nerve deficit or sensory deficit. Gait normal.       Normal heel toe walking.  Skin: Skin is warm and dry. No rash noted.  Psychiatric: She has a normal mood and affect. Her behavior is normal.     ED Course  Procedures     1. Headache       MDM  10:15 PM patient seen and evaluated. Patient no acute distress. Patient with normal nonfocal neuro exam.  Patient reports feeling much better after pain medications. Denies headache at this time. Patient ready to return home.      Angus Seller, [place] 08/05/11 [phone]

## 2011-08-04 NOTE — ED Notes (Addendum)
Patient states she has been having headaches for a month and has been seen here multiple times. Received Rx for Oxycodone. States it just made her sleep and did not take away headache. Reports having blurred vision.

## 2011-08-04 NOTE — Discharge Instructions (Signed)
Please followup with a primary care provider for continued evaluation and treatment of your headache symptoms. Return to the emergency room for any worsening symptoms.   Headache, General, Unknown Cause The specific cause of your headache may not have been found today. There are many causes and types of headache. A few common ones are:  Tension headache.   Migraine.   Infections (examples: dental and sinus infections).   Bone and/or joint problems in the neck or jaw.   Depression.   Eye problems.  These headaches are not life threatening.  Headaches can sometimes be diagnosed by a patient history and a physical exam. Sometimes, lab and imaging studies (such as x-ray and/or CT scan) are used to rule out more serious problems. In some cases, a spinal tap (lumbar puncture) may be requested. There are many times when your exam and tests may be normal on the first visit even when there is a serious problem causing your headaches. Because of that, it is very important to follow up with your doctor or local clinic for further evaluation. FINDING OUT THE RESULTS OF TESTS  If a radiology test was performed, a radiologist will review your results.   You will be contacted by the emergency department or your physician if any test results require a change in your treatment plan.   Not all test results may be available during your visit. If your test results are not back during the visit, make an appointment with your caregiver to find out the results. Do not assume everything is normal if you have not heard from your caregiver or the medical facility. It is important for you to follow up on all of your test results.  HOME CARE INSTRUCTIONS   Keep follow-up appointments with your caregiver, or any specialist referral.   Only take over-the-counter or prescription medicines for pain, discomfort, or fever as directed by your caregiver.   Biofeedback, massage, or other relaxation techniques may be  helpful.   Ice packs or heat applied to the head and neck can be used. Do this three to four times per day, or as needed.   Call your doctor if you have any questions or concerns.   If you smoke, you should quit.  SEEK MEDICAL CARE IF:   You develop problems with medications prescribed.   You do not respond to or obtain relief from medications.   You have a change from the usual headache.   You develop nausea or vomiting.  SEEK IMMEDIATE MEDICAL CARE IF:   If your headache becomes severe.   You have an unexplained oral temperature above 102 F (38.9 C), or as your caregiver suggests.   You have a stiff neck.   You have loss of vision.   You have muscular weakness.   You have loss of muscular control.   You develop severe symptoms different from your first symptoms.   You start losing your balance or have trouble walking.   You feel faint or pass out.  MAKE SURE YOU:   Understand these instructions.   Will watch your condition.   Will get help right away if you are not doing well or get worse.  Document Released: 03/29/2005 Document Revised: 03/18/2011 Document Reviewed: 11/16/2007 [facility] Patient Information 2012 [place], [place].    RESOURCE GUIDE  Dental Problems  Patients with Medicaid: [facility] [phone]  [address]                                           [address] Phone:  [phone]                                                  Phone:  [phone]  If unable to pay or uninsured, contact:  [facility] or [facility] to become qualified for the adult dental clinic.  Chronic Pain Problems Contact [facility]  [phone] Patients need to be referred by their primary care doctor.  Insufficient Money for Medicine Contact [employer]:  call "211" or [facility] (867)704-9565.  No Primary Care Doctor Call [facility]  [phone] Other agencies that  provide inexpensive medical care    [facility]  [phone]    [facility]  [phone]    [facility]  [phone]    [facility]  [phone]    [facility]  [phone]    [facility]  [phone]  Psychological Services [facility]  [phone] [facility]  [phone] [facility]   [phone] (emergency services ([phone])  Substance Abuse Resources [facility]  ([phone] [facility] 3518260100 [facility] [phone] Floydene Flock [phone] Residential & Outpatient Substance Abuse Program  [phone]  Abuse/Neglect [facility] Child Abuse Hotline 2406208659 [facility] Child Abuse Hotline 564-256-0375 (After Hours)  Emergency Shelter [facility] (680)185-6157  Maternity Homes [facility] at [facility] [phone] [facility] [phone]  MRSA Hotline #:   762-425-8571    [facility] Resources  [facility]     [employer]                          [facility] [address] [place]                       [address]      [address]  Blondell Reveal Phone:  [phone]                                   Phone:  [phone]                 Phone:  [phone]  [facility] Phone:  [phone]  [facility] Child Abuse Hotline 501-180-0332 [phone] (After Hours)

## 2011-08-05 ENCOUNTER — Encounter (HOSPITAL_COMMUNITY): Payer: Self-pay | Admitting: Emergency Medicine

## 2011-08-05 ENCOUNTER — Emergency Department (INDEPENDENT_AMBULATORY_CARE_PROVIDER_SITE_OTHER)
Admission: EM | Admit: 2011-08-05 | Discharge: 2011-08-05 | Disposition: A | Discharge status: Home / Self Care | Payer: No Typology Code available for payment source | Source: Home / Self Care | Attending: Emergency Medicine | Admitting: Emergency Medicine

## 2011-08-05 DIAGNOSIS — G44209 Tension-type headache, unspecified, not intractable: Secondary | ICD-10-CM

## 2011-08-05 MED ORDER — AMITRIPTYLINE HCL 25 MG PO TABS: 25.0000 mg | ORAL_TABLET | Freq: Every day | ORAL | Status: DC

## 2011-08-05 MED ORDER — NAPROXEN 500 MG PO TABS: 500.0000 mg | ORAL_TABLET | Freq: Two times a day (BID) | ORAL | Status: DC

## 2011-08-05 NOTE — ED Notes (Signed)
Pt here today for reports of ongoing migraine h/a's x 1 mnth unrelieved by prescribed pain meds.pt was seen @ [facility] ER with same sx yesterday and given iv pain meds with relief but woke up with whole right side head radiating to back of head/blurry vision/nausea.no vomiting or feeling faint.vss

## 2011-08-05 NOTE — Discharge Instructions (Signed)
Tension Headache (Muscle Contraction Headache) Tension headache is one of the most common causes of head pain. These headaches are usually felt as a pain over the top of your head and back of your neck. Stress, anxiety, and depression are common triggers for these headaches. Tension headaches are not life-threatening and will not lead to other types of headaches. Tension headaches can often be diagnosed by taking a history from the patient and a physical exam. Sometimes, further lab and x-ray studies are used to confirm the diagnosis. Your caregiver can advise you on how to get help solving problems that cause anxiety or stress. Antidepressants can be prescribed if depression is a problem. HOME CARE INSTRUCTIONS   If testing was done, call for your results. Remember, it is your responsibility to get the results of all testing. Do not assume everything is fine because you do not hear from your caregiver.   Only take over-the-counter or prescription medicines for pain, discomfort, or fever as directed by your caregiver.   Biofeedback, massage, or other relaxation techniques may be helpful.   Ice packs or heat to the head and neck can be used. Use these three to four times per day or as needed.   Physical therapy may be a useful addition to treatment.   If headaches continue, even with therapy, you may need to think about lifestyle changes.   Avoid excessive use of pain killers, as rebound headaches can occur.  SEEK MEDICAL CARE IF:   You develop problems with medications prescribed.   You do not respond or get no relief from medications.   You have a change from the usual headache.   You develop nausea (feeling sick to your stomach) or vomiting.  SEEK IMMEDIATE MEDICAL CARE IF:   Your headache becomes severe.   You have an unexplained oral temperature above 102 F (38.9 C).   You develop a stiff neck.   You have loss of vision.   You have muscular weakness.   You have loss of  muscular control.   You develop severe symptoms different from your first symptoms.   You start losing your balance or have trouble walking.   You feel faint or pass out.  MAKE SURE YOU:   Understand these instructions.   Will watch your condition.   Will get help right away if you are not doing well or get worse.  Document Released: 03/29/2005 Document Revised: 03/18/2011 Document Reviewed: 11/16/2007 [employer] Patient Information 2012 [employer]. 

## 2011-08-05 NOTE — ED Provider Notes (Signed)
Chief Complaint  Patient presents with  . Migraine    History of Present Illness:   Diane Wong is a 26 year old female who has had a one-month history of intermittent headaches. These occur daily and last most of the day, but may go away for hours at a time. They're located on the right side and radiate towards the occipital area. It feels like a pressure or sharp feeling. She denies any throbbing sensation. At the worst the headaches are very severe rated 10 over 10 in intensity, although right now is rated a 6/10 in intensity. She occasionally gets nauseated with the headache but not all the time and never vomits. She denies any photophobia or phonophobia. The headache is better with activity. She also has blurry vision in both eyes that comes and goes although not related to the headache. She does have some sneezing for which she takes Zyrtec which controls it well. She also notes postural presyncope. She is occasionally depressed but not all the time. She denies any anxiety. She has had no fever, chills, sore throat, stiff neck, URI symptoms, or cough. She denies any paresthesias, numbness, tingling, localized muscle weakness, difficulty with speech, swallowing, ambulation, or coordination. She states her balance is poor. She denies any seizures or syncope. Her headaches are worse if she stares at a computer screen for long period of time.  She's been in the emergency room and here multiple times throughout the past several months. Some of these have been for upper respiratory infections. She was seen because of a sub-conjunctival hematoma. She was seen last night at the emergency department at the hospital and diagnosed with a migraine headache. She was given Benadryl and several other medications intravenously. These only relieved her headache for about an hour, then the headache came back again. She's been taking Excedrin Migraine occasionally and also oxycodone occasionally without any relief. She has had  headaches in the past usually associated with doing school work. She does not have any definite history of migraine headache.  Review of Systems:  Other than noted above, the patient denies any of the following symptoms: Systemic:  No fever, chills, fatigue, photophobia, stiff neck. Eye:  No redness, eye pain, discharge, blurred vision, or diplopia. ENT:  No nasal congestion, rhinorrhea, sinus pressure or pain, sneezing, earache, or sore throat.  No jaw claudication. Neuro:  No paresthesias, loss of consciousness, seizure activity, muscle weakness, trouble with coordination or gait, trouble speaking or swallowing. Psych:  No depression, anxiety or trouble sleeping.  PMFSH:  Past medical history, family history, social history, meds, and allergies were reviewed.  Physical Exam:   Vital signs:  BP 131/83  Pulse 104  Temp(Src) 97.6 F (36.4 C) (Oral)  Resp 16  SpO2 100%  LMP 08/04/2011 General:  Alert and oriented.  In no distress. Eye:  Lids and conjunctivas normal.  PERRL,  Full EOMs.  Fundi benign with normal discs and vessels. ENT:  No cranial or facial tenderness to palpation.  TMs and canals clear.  Nasal mucosa was normal and uncongested without any drainage. No intra oral lesions, pharynx clear, mucous membranes moist, dentition normal. Neck:  Supple, full ROM, no tenderness to palpation.  No adenopathy or mass. Neuro:  Alert and orented times 3.  Speech was clear, fluent, and appropriate.  Cranial nerves intact. No pronator drift, muscle strength normal. Finger to nose normal.  DTRs 2+ .Station and gait were normal.  Romberg's sign was normal.  Able to perform tandem gait well. Psych:  Normal affect.  Assessment:  The encounter diagnosis was Tension type headache.  Plan:   1.  The following meds were prescribed:   New Prescriptions   AMITRIPTYLINE (ELAVIL) 25 MG TABLET    Take 1 tablet (25 mg total) by mouth at bedtime.   NAPROXEN (NAPROSYN) 500 MG TABLET    Take 1 tablet (500  mg total) by mouth 2 (two) times daily.   2.  The patient was instructed in symptomatic care and handouts were given. 3.  The patient was told to return if becoming worse in any way, if no better in 3 or 4 days, and given some red flag symptoms that would indicate earlier return.  Follow up:  The patient was told to follow up with Dr. Porfirio Mylar Dohmeier within the next month.     Reuben Likes, MD 08/05/11 2011

## 2011-08-06 NOTE — ED Provider Notes (Signed)
Medical screening examination/treatment/procedure(s) were performed by non-physician practitioner and as supervising physician I was immediately available for consultation/collaboration.    Chastity Noland R Suriyah Vergara, MD 08/06/11 1534 

## 2011-08-09 ENCOUNTER — Encounter (HOSPITAL_COMMUNITY): Payer: Self-pay

## 2011-08-09 ENCOUNTER — Emergency Department (HOSPITAL_COMMUNITY)
Admission: EM | Admit: 2011-08-09 | Discharge: 2011-08-09 | Disposition: A | Discharge status: Home / Self Care | Payer: Self-pay | Attending: Emergency Medicine | Admitting: Emergency Medicine

## 2011-08-09 ENCOUNTER — Emergency Department (HOSPITAL_COMMUNITY): Payer: Self-pay

## 2011-08-09 DIAGNOSIS — W19XXXA Unspecified fall, initial encounter: Secondary | ICD-10-CM

## 2011-08-09 DIAGNOSIS — G629 Polyneuropathy, unspecified: Secondary | ICD-10-CM

## 2011-08-09 DIAGNOSIS — G589 Mononeuropathy, unspecified: Secondary | ICD-10-CM | POA: Insufficient documentation

## 2011-08-09 DIAGNOSIS — M25561 Pain in right knee: Secondary | ICD-10-CM

## 2011-08-09 DIAGNOSIS — M25569 Pain in unspecified knee: Secondary | ICD-10-CM | POA: Insufficient documentation

## 2011-08-09 DIAGNOSIS — W010XXA Fall on same level from slipping, tripping and stumbling without subsequent striking against object, initial encounter: Secondary | ICD-10-CM | POA: Insufficient documentation

## 2011-08-09 MED ORDER — IBUPROFEN 800 MG PO TABS: 800.0000 mg | ORAL_TABLET | Freq: Three times a day (TID) | ORAL | Status: AC | PRN

## 2011-08-09 NOTE — ED Notes (Addendum)
Pt states that she fell on right leg on Saturday. Pt states that she feels numb from middle of thigh down to middle of shin except where her scrape is on her knee- which is very painful. Pt states she cannot feel herself hit or touch or pinch her leg. Pt did ambulate to room in NAD.

## 2011-08-09 NOTE — ED Provider Notes (Signed)
History     CSN: [id number]  Arrival date & time 08/09/11  1717   First MD Initiated Contact with Patient 08/09/11 1815      Chief Complaint  Patient presents with  . Fall    (Consider location/radiation/quality/duration/timing/severity/associated sxs/prior treatment) Patient is a 26 y.o. female presenting with fall. The history is provided by the patient.  Fall The accident occurred 2 days ago. The fall occurred while walking. Associated symptoms include numbness. Pertinent negatives include no fever, no abdominal pain, no nausea, no vomiting and no headaches.  Fall to ground from standing after heel on shoe broke. Landed on both knees. Pain to right knee, moderate severity, rad to proximal lower leg. Pain throbbing, constant. Assoc with numbness to anterior knee and lower leg. + abrasion, bruising. Negative weakness. Able to ambulate with pain. No head injury or LOC. No back pain, incontinence, urinary retention. Ambulation, movement make pain worse. Nothing makes it better. No prior treatment or eval.  Past Medical History  Diagnosis Date  . Asthma     Past Surgical History  Procedure Date  . Cholecystectomy     Family History  Problem Relation Age of Onset  . Hypertension Mother     History  Substance Use Topics  . Smoking status: Current Everyday Smoker    Types: Cigars  . Smokeless tobacco: Not on file  . Alcohol Use: Yes     Occasional      Review of Systems  Constitutional: Negative for fever and chills.  HENT: Negative for neck pain and neck stiffness.   Eyes: Negative for pain and visual disturbance.  Respiratory: Negative for shortness of breath.   Cardiovascular: Negative for chest pain and leg swelling.  Gastrointestinal: Negative for nausea, vomiting and abdominal pain.  Musculoskeletal:       See HPI, otherwise negative  Skin: Positive for color change and wound.  Neurological: Positive for numbness. Negative for dizziness, weakness,  light-headedness and headaches.  Psychiatric/Behavioral: Negative for confusion.    Allergies  Review of patient's allergies indicates no known allergies.  Home Medications   Current Outpatient Rx  Name Route Sig Dispense Refill  . AMITRIPTYLINE HCL 25 MG PO TABS Oral Take 25 mg by mouth at bedtime.    Marland Kitchen CETIRIZINE HCL 10 MG PO TABS Oral Take 10 mg by mouth daily.    Marland Kitchen NAPROXEN 500 MG PO TABS Oral Take 500 mg by mouth 2 (two) times daily.    Marland Kitchen POLYVINYL ALCOHOL 1.4 % OP SOLN Left Eye Place 1 drop into the left eye as needed. For hemorrhage      BP 122/79  Pulse 97  Temp(Src) 98.8 F (37.1 C) (Oral)  Resp 18  SpO2 97%  LMP 08/04/2011  Physical Exam  Constitutional: She is oriented to person, place, and time. She appears well-developed and well-nourished. No distress.       Vital signs are reviewed and are normal  HENT:  Head: Normocephalic and atraumatic.  Eyes: Pupils are equal, round, and reactive to light.  Neck: Normal range of motion. Neck supple.  Cardiovascular: Normal rate and regular rhythm.   Pulmonary/Chest: Effort normal. No respiratory distress.  Musculoskeletal: Normal range of motion.       Right knee: She exhibits normal range of motion, no ecchymosis, no deformity, no laceration, no erythema, no LCL laxity and no MCL laxity. tenderness found. Medial joint line tenderness noted. No patellar tendon tenderness noted.       Left ankle: She exhibits normal  range of motion, no swelling, no deformity and normal pulse. tenderness. AITFL tenderness found. No lateral malleolus, no medial malleolus, no head of 5th metatarsal and no proximal fibula tenderness found. Achilles tendon normal.       Legs:      Feet:       Examination of right knee is limited by body habitus.    No pain on palpation to the entire spine.   Neurological: She is alert and oriented to person, place, and time. She has normal strength. Abnormal gait: antalgic gait, no foot drop. GCS eye subscore  is 4. GCS verbal subscore is 5. GCS motor subscore is 6.  Reflex Scores:      Patellar reflexes are 2+ on the right side and 2+ on the left side.      Decreased sensation to light touch over right medial and lateral lower leg, anterior knee. Otherwise normal/symmetric sensation to light touch in BLE.     ED Course  Procedures (including critical care time)  Labs Reviewed - No data to display Dg Tibia/fibula Right  08/09/2011  *RADIOLOGY REPORT*  Clinical Data: Fall.  Anterior leg pain.  RIGHT TIBIA AND FIBULA - 2 VIEW  Comparison: None.  Findings: No fracture, foreign body, or acute bony findings are identified.  Achilles calcaneal spur noted.  IMPRESSION:  1.  No acute bony findings. 2.  Achilles calcaneal spur.  Original Report Authenticated By: Dellia Cloud, M.D.   Dg Knee Complete 4 Views Right  08/09/2011  *RADIOLOGY REPORT*  Clinical Data: Fall.  Anterior knee pain.  RIGHT KNEE - COMPLETE 4+ VIEW  Comparison: 04/17/2010  Findings: No fracture, knee effusion, or acute bony findings noted. No foreign body is evident.  IMPRESSION:  1.  No significant abnormality identified.  Original Report Authenticated By: Dellia Cloud, M.D.    Dx 1: Fall Dx 2: Right knee pain Dx 3: Neuropathy   MDM  Mechanical fall with pain to right knee, decreased sensation to anterior R lower leg with intact pulses, strength, and reflexes. Ankle pain on examination with no bony tenderness, no deformity, and no trouble bearing weight on affected extremity- no imaging indicated. Imaging of the right knee and lower leg performed, reviewed- no acute findings. Discussed neuropraxia prognosis with pt, who will follow-up with neurology if no improvement over the next several weeks. Knee immobilizer placed with instructions to f/u with ortho if no knee pain improvement in 1 week.        Shaaron Adler, PA-C 08/09/11 2117  Shaaron Adler, PA-C 08/09/11 2118

## 2011-08-09 NOTE — Discharge Instructions (Signed)
Use ibuprofen every 8 hours as needed for pain. Use the knee immobilizer to compress the injured area. Elevate the knee for the next few days, if you can. Apply ice for 15-20 minutes three times a day for the next several days.      Knee Pain The knee is the complex joint between your thigh and your lower leg. It is made up of bones, tendons, ligaments, and cartilage. The bones that make up the knee are:  The femur in the thigh.   The tibia and fibula in the lower leg.   The patella or kneecap riding in the groove on the lower femur.  CAUSES  Knee pain is a common complaint with many causes. A few of these causes are:  Injury, such as:   A ruptured ligament or tendon injury.   Torn cartilage.   Medical conditions, such as:   Gout   Arthritis   Infections   Overuse, over training or overdoing a physical activity.  Knee pain can be minor or severe. Knee pain can accompany debilitating injury. Minor knee problems often respond well to self-care measures or get well on their own. More serious injuries may need medical intervention or even surgery. SYMPTOMS The knee is complex. Symptoms of knee problems can vary widely. Some of the problems are:  Pain with movement and weight bearing.   Swelling and tenderness.   Buckling of the knee.   Inability to straighten or extend your knee.   Your knee locks and you cannot straighten it.   Warmth and redness with pain and fever.   Deformity or dislocation of the kneecap.  DIAGNOSIS  Determining what is wrong may be very straight forward such as when there is an injury. It can also be challenging because of the complexity of the knee. Tests to make a diagnosis may include:  Your caregiver taking a history and doing a physical exam.   Routine X-rays can be used to rule out other problems. X-rays will not reveal a cartilage tear. Some injuries of the knee can be diagnosed by:   Arthroscopy a surgical technique by which a small  video camera is inserted through tiny incisions on the sides of the knee. This procedure is used to examine and repair internal knee joint problems. Tiny instruments can be used during arthroscopy to repair the torn knee cartilage (meniscus).   Arthrography is a radiology technique. A contrast liquid is directly injected into the knee joint. Internal structures of the knee joint then become visible on X-ray film.   An MRI scan is a non x-ray radiology procedure in which magnetic fields and a computer produce two- or three-dimensional images of the inside of the knee. Cartilage tears are often visible using an MRI scanner. MRI scans have largely replaced arthrography in diagnosing cartilage tears of the knee.   Blood work.   Examination of the fluid that helps to lubricate the knee joint (synovial fluid). This is done by taking a sample out using a needle and a syringe.  TREATMENT The treatment of knee problems depends on the cause. Some of these treatments are:  Depending on the injury, proper casting, splinting, surgery or physical therapy care will be needed.   Give yourself adequate recovery time. Do not overuse your joints. If you begin to get sore during workout routines, back off. Slow down or do fewer repetitions.   For repetitive activities such as cycling or running, maintain your strength and nutrition.   Alternate muscle  groups. For example if you are a weight lifter, work the upper body on one day and the lower body the next.   Either tight or weak muscles do not give the proper support for your knee. Tight or weak muscles do not absorb the stress placed on the knee joint. Keep the muscles surrounding the knee strong.   Take care of mechanical problems.   If you have flat feet, orthotics or special shoes may help. See your caregiver if you need help.   Arch supports, sometimes with wedges on the inner or outer aspect of the heel, can help. These can shift pressure away from the  side of the knee most bothered by osteoarthritis.   A brace called an "unloader" brace also may be used to help ease the pressure on the most arthritic side of the knee.   If your caregiver has prescribed crutches, braces, wraps or ice, use as directed. The acronym for this is PRICE. This means protection, rest, ice, compression and elevation.   Nonsteroidal anti-inflammatory drugs (NSAID's), can help relieve pain. But if taken immediately after an injury, they may actually increase swelling. Take NSAID's with food in your stomach. Stop them if you develop stomach problems. Do not take these if you have a history of ulcers, stomach pain or bleeding from the bowel. Do not take without your caregiver's approval if you have problems with fluid retention, heart failure, or kidney problems.   For ongoing knee problems, physical therapy may be helpful.   Glucosamine and chondroitin are over-the-counter dietary supplements. Both may help relieve the pain of osteoarthritis in the knee. These medicines are different from the usual anti-inflammatory drugs. Glucosamine may decrease the rate of cartilage destruction.   Injections of a corticosteroid drug into your knee joint may help reduce the symptoms of an arthritis flare-up. They may provide pain relief that lasts a few months. You may have to wait a few months between injections. The injections do have a small increased risk of infection, water retention and elevated blood sugar levels.   Hyaluronic acid injected into damaged joints may ease pain and provide lubrication. These injections may work by reducing inflammation. A series of shots may give relief for as long as 6 months.   Topical painkillers. Applying certain ointments to your skin may help relieve the pain and stiffness of osteoarthritis. Ask your pharmacist for suggestions. Many over the-counter products are approved for temporary relief of arthritis pain.   In some countries, doctors often  prescribe topical NSAID's for relief of chronic conditions such as arthritis and tendinitis. A review of treatment with NSAID creams found that they worked as well as oral medications but without the serious side effects.  PREVENTION  Maintain a healthy weight. Extra pounds put more strain on your joints.   Get strong, stay limber. Weak muscles are a common cause of knee injuries. Stretching is important. Include flexibility exercises in your workouts.   Be smart about exercise. If you have osteoarthritis, chronic knee pain or recurring injuries, you may need to change the way you exercise. This does not mean you have to stop being active. If your knees ache after jogging or playing basketball, consider switching to swimming, water aerobics or other low-impact activities, at least for a few days a week. Sometimes limiting high-impact activities will provide relief.   Make sure your shoes fit well. Choose footwear that is right for your sport.   Protect your knees. Use the proper gear for knee-sensitive  activities. Use kneepads when playing volleyball or laying carpet. Buckle your seat belt every time you drive. Most shattered kneecaps occur in car accidents.   Rest when you are tired.  SEEK MEDICAL CARE IF:  You have knee pain that is continual and does not seem to be getting better.  SEEK IMMEDIATE MEDICAL CARE IF:  Your knee joint feels hot to the touch and you have a high fever. MAKE SURE YOU:   Understand these instructions.   Will watch your condition.   Will get help right away if you are not doing well or get worse.  Document Released: 01/24/2007 Document Revised: 03/18/2011 Document Reviewed: 01/24/2007 [facility] Patient Information 2012 [place], [place].         Neuropathy Neuropathy means your peripheral nerves are not working normally. Peripheral nerves are the nerves outside the brain and spinal cord. Messages between the brain and the rest of the body do not work  properly with peripheral nerve disorders. CAUSES There are many different causes of peripheral nerve disorders. These include:  Injury.   Infections.   Diabetes.   Vitamin deficiency.   Poor circulation.   Alcoholism.   Exposure to toxins.   Drug effects.   Tumors.   Kidney disease.  SYMPTOMS  Tingling, burning, pain, and numbness in the extremities.   Weakness and loss of muscle tone and size.  DIAGNOSIS Blood tests and special studies of nerve function may help confirm the diagnosis.  TREATMENT  Treatment includes adopting healthy life habits.   A good diet, vitamin supplements, and mild pain medicine may be needed.   Avoid known toxins such as alcohol, tobacco, and recreational drugs.   Anti-convulsant medicines are helpful in some types of neuropathy.  Make a follow-up appointment with your caregiver to be sure you are getting better with treatment.  SEEK IMMEDIATE MEDICAL CARE IF:   You have breathing problems.   You have severe or uncontrolled pain.   You notice extreme weakness or you feel faint.   You are not better after 1 week or if you have worse symptoms.  Document Released: 05/06/2004 Document Revised: 12/09/2010 Document Reviewed: 03/29/2005 [facility] Patient Information 2012 [place], [place].

## 2011-08-09 NOTE — Progress Notes (Signed)
Orthopedic Tech Progress Note Patient Details:  Diane Wong 1985/04/21 [id number]  Other Ortho Devices Type of Ortho Device: Crutches;Knee Immobilizer Ortho Device Location: (R) LE Ortho Device Interventions: Application;Ordered   [name] 08/09/2011, 9:06 PM

## 2011-08-09 NOTE — ED Notes (Signed)
sts saturdya night heel broke and she fell, abrasion to knee on right side and now leg feels numb and has sharp pains.

## 2011-08-10 NOTE — ED Provider Notes (Signed)
Medical screening examination/treatment/procedure(s) were performed by non-physician practitioner and as supervising physician I was immediately available for consultation/collaboration.   Shanedra Lave, MD 08/10/11 2336 

## 2011-08-17 ENCOUNTER — Emergency Department (INDEPENDENT_AMBULATORY_CARE_PROVIDER_SITE_OTHER)
Admission: EM | Admit: 2011-08-17 | Discharge: 2011-08-17 | Disposition: A | Discharge status: Home / Self Care | Payer: Self-pay | Source: Home / Self Care | Attending: Family Medicine | Admitting: Family Medicine

## 2011-08-17 ENCOUNTER — Emergency Department (INDEPENDENT_AMBULATORY_CARE_PROVIDER_SITE_OTHER): Payer: No Typology Code available for payment source

## 2011-08-17 ENCOUNTER — Encounter (HOSPITAL_COMMUNITY): Payer: Self-pay | Admitting: *Deleted

## 2011-08-17 DIAGNOSIS — M25561 Pain in right knee: Secondary | ICD-10-CM

## 2011-08-17 DIAGNOSIS — M25569 Pain in unspecified knee: Secondary | ICD-10-CM

## 2011-08-17 MED ORDER — TRAMADOL HCL 50 MG PO TABS: 50.0000 mg | ORAL_TABLET | Freq: Four times a day (QID) | ORAL | Status: AC | PRN

## 2011-08-17 MED ORDER — DICLOFENAC POTASSIUM 50 MG PO TABS: 50.0000 mg | ORAL_TABLET | Freq: Two times a day (BID) | ORAL | Status: DC | PRN

## 2011-08-17 NOTE — ED Notes (Signed)
Pt  Injured her  Knee     28  April   Seen er 3   Days   Later  Had  X  Rays  Given  Motrin and  Told  To  followup  With ortho  -   She  Reports  Has  appt in June  With  Dr  Shon Baton    She      Reports     Pain is  Worse  -  Worse  On movement  Palpation and  Weight bearing

## 2011-08-17 NOTE — Discharge Instructions (Signed)
There are not signs of bone injury or fractures injure her x-rays today either. There is also no fluid in your knee.  Take the prescribed medications as instructed. Keeping the sleeve in place until pain resolves but remove at least 3 times a day for knee exercises. Followup with the orthopedic doctor as scheduled.

## 2011-08-19 NOTE — ED Provider Notes (Signed)
History     CSN: [id number]  Arrival date & time 08/17/11  1847   First MD Initiated Contact with Patient 08/17/11 1859      Chief Complaint  Patient presents with  . Knee Pain    (Consider location/radiation/quality/duration/timing/severity/associated sxs/prior treatment) HPI Comments: 26 y/o obese female with recent h/o a fall injuring her right knee. Was seen at [facility] ED on 4/29 normal right knee and tibial bone Xrays. Has appointment for orthopedic next month. Here c/o persistent right knee pain worse with putting weight on it. Not taking any medication for pain during the last week and reports her "pain is "immune" to ibuprofen because she has taken too much ibuprofen since childhood" but not taking any other pain medications either.  Denies re injury. No calf tenderness. No new knee swelling but states "all the bruising came after last ED visit". No using knee immobilizer or crutches as make her pain worse.   Past Medical History  Diagnosis Date  . Asthma     Past Surgical History  Procedure Date  . Cholecystectomy     Family History  Problem Relation Age of Onset  . Hypertension Mother     History  Substance Use Topics  . Smoking status: Current Everyday Smoker    Types: Cigars  . Smokeless tobacco: Not on file  . Alcohol Use: Yes     Occasional     OB History    Grav Para Term Preterm Abortions TAB SAB Ect Mult Living                  Review of Systems  Musculoskeletal:       As per HPI  All other systems reviewed and are negative.    Allergies  Review of patient's allergies indicates no known allergies.  Home Medications   Current Outpatient Rx  Name Route Sig Dispense Refill  . AMITRIPTYLINE HCL 25 MG PO TABS Oral Take 25 mg by mouth at bedtime.    Marland Kitchen CETIRIZINE HCL 10 MG PO TABS Oral Take 10 mg by mouth daily.    Marland Kitchen DICLOFENAC POTASSIUM 50 MG PO TABS Oral Take 1 tablet (50 mg total) by mouth 2 (two) times daily as needed. 20 tablet 0  .  IBUPROFEN 800 MG PO TABS Oral Take 1 tablet (800 mg total) by mouth every 8 (eight) hours as needed for pain. 21 tablet 0  . POLYVINYL ALCOHOL 1.4 % OP SOLN Left Eye Place 1 drop into the left eye as needed. For hemorrhage    . TRAMADOL HCL 50 MG PO TABS Oral Take 1 tablet (50 mg total) by mouth every 6 (six) hours as needed for pain. 15 tablet 0    BP 125/84  Pulse 84  Temp(Src) 98.3 F (36.8 C) (Oral)  Resp 20  SpO2 99%  LMP 08/04/2011  Physical Exam  Nursing note and vitals reviewed. Constitutional: She is oriented to person, place, and time. She appears well-developed and well-nourished. No distress.       Morbidly obese  HENT:  Head: Normocephalic and atraumatic.  Cardiovascular: Normal heart sounds.   Pulmonary/Chest: Breath sounds normal.  Musculoskeletal:       Right knee: obese. Impress no effusion. Tenderness to palpation diffuse more below the patella and tibial crease. Impress full range of motion. Bruising in healing stages over proximal tibia, area also tender to palpation. Able to bear weight but reports pain.  Neurological: She is alert and oriented to person, place, and time.  ED Course  Procedures (including critical care time)  Labs Reviewed - No data to display Dg Knee Complete 4 Views Right  08/17/2011  *RADIOLOGY REPORT*  Clinical Data: Status post fall; right knee pain.  RIGHT KNEE - COMPLETE 4+ VIEW  Comparison: Right knee radiographs performed 08/09/2011  Findings: There is no evidence of fracture or dislocation.  The joint spaces are preserved.  No significant degenerative change is seen; the patellofemoral joint is grossly unremarkable in appearance.  No significant joint effusion is seen.  The visualized soft tissues are normal in appearance.  IMPRESSION: No evidence of fracture or dislocation.  Original Report Authenticated By: Tonia Ghent, M.D.     1. Knee joint pain, right       MDM  Repeat X-rays also negative for fractures, effusion or  dislocations. Prescribed diclofenac and tramadol. Ace wrap placed as pt body habitus doe not allow for knee sleeve and knee immobilizer is too bulky. Asked to follow up with orthopedic as scheduled.         Sharin Grave, MD 08/19/11 1233

## 2011-09-09 ENCOUNTER — Emergency Department (HOSPITAL_COMMUNITY)
Admission: EM | Admit: 2011-09-09 | Discharge: 2011-09-09 | Disposition: A | Discharge status: Home / Self Care | Payer: Self-pay | Attending: Emergency Medicine | Admitting: Emergency Medicine

## 2011-09-09 ENCOUNTER — Encounter (HOSPITAL_COMMUNITY): Payer: Self-pay | Admitting: Emergency Medicine

## 2011-09-09 DIAGNOSIS — F17200 Nicotine dependence, unspecified, uncomplicated: Secondary | ICD-10-CM | POA: Insufficient documentation

## 2011-09-09 DIAGNOSIS — E669 Obesity, unspecified: Secondary | ICD-10-CM | POA: Insufficient documentation

## 2011-09-09 DIAGNOSIS — M25469 Effusion, unspecified knee: Secondary | ICD-10-CM | POA: Insufficient documentation

## 2011-09-09 DIAGNOSIS — M25461 Effusion, right knee: Secondary | ICD-10-CM

## 2011-09-09 HISTORY — DX: Obesity, unspecified: E66.9

## 2011-09-09 MED ORDER — HYDROCODONE-ACETAMINOPHEN 5-325 MG PO TABS
1.0000 | ORAL_TABLET | Freq: Once | ORAL | Status: AC
  Administered 2011-09-09: 1 via ORAL
  Filled 2011-09-09: qty 1

## 2011-09-09 NOTE — Discharge Instructions (Signed)
You were seen for continued pain in your right knee. Your providers recommend that you use rest, ice, compression and elevation to reduce pain and swelling in her knee. Please use the crutches your providers and the wrap appeared knee. Given given orthopedic specialist for you to followup with. Please call their office to make an appointment.    Knee Effusion The medical term for having fluid in your knee is effusion. This is often due to an internal derangement of the knee. This means something is wrong inside the knee. Some of the causes of fluid in the knee may be torn cartilage, a torn ligament, or bleeding into the joint from an injury. Your knee is likely more difficult to bend and move. This is often because there is increased pain and pressure in the joint. The time it takes for recovery from a knee effusion depends on different factors, including:   Type of injury.   Your age.   Physical and medical conditions.   Rehabilitation Strategies.  How long you will be away from your normal activities will depend on what kind of knee problem you have and how much damage is present. Your knee has two types of cartilage. Articular cartilage covers the bone ends and lets your knee bend and move smoothly. Two menisci, thick pads of cartilage that form a rim inside the joint, help absorb shock and stabilize your knee. Ligaments bind the bones together and support your knee joint. Muscles move the joint, help support your knee, and take stress off the joint itself. CAUSES  Often an effusion in the knee is caused by an injury to one of the menisci. This is often a tear in the cartilage. Recovery after a meniscus injury depends on how much meniscus is damaged and whether you have damaged other knee tissue. Small tears may heal on their own with conservative treatment. Conservative means rest, limited weight bearing activity and muscle strengthening exercises. Your recovery may take up to 6 weeks.    TREATMENT  Larger tears may require surgery. Meniscus injuries may be treated during arthroscopy. Arthroscopy is a procedure in which your surgeon uses a small telescope like instrument to look in your knee. Your caregiver can make a more accurate diagnosis (learning what is wrong) by performing an arthroscopic procedure. If your injury is on the inner margin of the meniscus, your surgeon may trim the meniscus back to a smooth rim. In other cases your surgeon will try to repair a damaged meniscus with stitches (sutures). This may make rehabilitation take longer, but may provide better long term result by helping your knee keep its shock absorption capabilities. Ligaments which are completely torn usually require surgery for repair. HOME CARE INSTRUCTIONS  Use crutches as instructed.   If a brace is applied, use as directed.   Once you are home, an ice pack applied to your swollen knee may help with discomfort and help decrease swelling.   Keep your knee raised (elevated) when you are not up and around or on crutches.   Only take over-the-counter or prescription medicines for pain, discomfort, or fever as directed by your caregiver.   Your caregivers will help with instructions for rehabilitation of your knee. This often includes strengthening exercises.   You may resume a normal diet and activities as directed.  SEEK MEDICAL CARE IF:   There is increased swelling in your knee.   You notice redness, swelling, or increasing pain in your knee.   An unexplained oral temperature  above 102 F (38.9 C) develops.  SEEK IMMEDIATE MEDICAL CARE IF:   You develop a rash.   You have difficulty breathing.   You have any allergic reactions from medications you may have been given.   There is severe pain with any motion of the knee.  MAKE SURE YOU:   Understand these instructions.   Will watch your condition.   Will get help right away if you are not doing well or get worse.  Document  Released: 06/19/2003 Document Revised: 03/18/2011 Document Reviewed: 08/23/2007 [facility] Patient Information 2012 [place], [place].    RESOURCE GUIDE  Chronic Pain Problems: Contact [facility]  [phone] Patients need to be referred by their primary care doctor.  Insufficient Money for Medicine: Contact United Way:  call "211" or [facility] 810-629-0559.  No Primary Care Doctor: - Call [facility]  [phone] - can help you locate a primary care doctor that  accepts your insurance, provides certain services, etc. - Physician Referral Service- [phone]  Agencies that provide inexpensive medical care: - [facility]  [phone] - [facility]  [phone] - [facility]  [phone] - Women's Clinic  [phone] - Planned Parenthood  [phone] [facility]  [phone]  Medicaid-accepting [facility] Providers: - [facility]- [address]  [phone], Mon-Fri 9am-7pm, Sat 9am-1pm - [facility]- [address] [place], [address] [place]  [phone] - [facility]- [address] [place]  [phone] [facility]- [address]  [phone] - Renaye Rakers- [address] [place], [address], [phone]  Only accepts [place] patients after they have their name  applied to their card  Self Pay (no insurance) in [place]: - Sickle Cell Patients: Dr Willey Blade, [facility]  [address] [place], [phone] - [facility]- [address] [place]  [phone]       [facility] [place]- [address]       -     [facility]- see information above (Speak to [employer] if you do not have insurance)       -  [facility]- [address] [place], [phone]       -  [facility]- [address],  [phone]       -  [facility]- [address], [phone]       -  Dr Julio Sicks-  [address], [place], [phone]       -  [facility]- [address] [phone]       -  [facility]- [address], [phone], also [address], [phone]       -    [facility]- [address] [place], [phone], 1st & 3rd Saturday   every month, 10am-1pm  1) Find a Doctor and Pay Out of Pocket Although you won't have to find out who is covered by your insurance plan, it is a good idea to ask around and get recommendations. You will then need to call the office and see if the doctor you have chosen will accept you as a new patient and what types of options they offer for patients who are self-pay. Some doctors offer discounts  or will set up payment plans for their patients who do not have insurance, but you will need to ask so you aren't surprised when you get to your appointment.  2) Contact Your Local Health Department Not all health departments have doctors that can see patients for sick visits, but many do, so it is worth a call to see if yours does. If you don't know where your local health department is, you can check in your phone book. The CDC also has a tool to help you locate your state's health department, and many state websites also have listings of all of their local health departments.  3) Find a Walk-in Clinic If your illness is not likely to be very severe or complicated, you may want to try a walk in clinic. These are popping up all over the country in pharmacies, drugstores, and shopping centers. They're usually staffed by nurse practitioners or physician assistants that have been trained to treat common illnesses and complaints. They're usually fairly quick and inexpensive. However, if you have serious medical issues or chronic medical problems, these are probably not your best option  STD Testing - [facility] Department of [facility] [place], STD Clinic,  [address] [place], phone [phone] or [phone].  Monday - Friday, call for an appointment. [facility] Department of [employer], STD Clinic, [place] [address], [place], phone [phone] or [phone].  Monday - Friday, call for an appointment.  Abuse/Neglect: [facility] Child Abuse Hotline [phone] [facility] Child Abuse Hotline 302-530-1645 (After Hours)  Emergency Shelter:  [facility] 413-700-6250  Maternity Homes: - [facility] at [facility] [place] [facility] 318-060-7548 - [facility] [phone]  MRSA Hotline #:   3120268726  [facility]  [facility] [place]  United Way [facility] Dept. [address]                 [address]         [address] [place] [address]  Blondell Reveal Phone:  [phone]                                  Phone:  [phone]                   Phone:  [phone]  [facility] Mental Health, [phone] - [facility] - [facility]707 [phone]       -     [facility] in [place], [address]                                  [phone], Insurance  [place] Child Abuse Hotline (774)271-8517 or (281)153-5040 (After Hours)   Behavioral Health Services  Substance Abuse Resources: - [facility] and Drug Services  [phone] - Addiction  [facility] [phone] - [facility] ([phone] Floydene Flock [phone] - Residential & Outpatient Substance Abuse Program  (626)415-7227  Psychological Services: [facility]  [phone] Services  [phone] - [facility], [phone] [place]. [address] [place], ACCESS LINE: 925-747-6605 or [phone], [employer]za  Dental Assistance  If unable to  pay or uninsured, contact:  [facility] or [facility]. to become qualified for the adult dental clinic.  Patients with Medicaid: [facility] [phone] [address], [phone] [address] [phone]  If unable to pay, or uninsured, contact [facility] [phone]) or [facility] [place] [facility] Department [phone] in [place], [phone] in [facility]) to become qualified for the adult dental clinic  Other Low-Cost Community Dental Services: - [facility]- [address] [place], [place], [place], [address], [phone], [address], 2nd and 4th Thursday of the month at 6:30am.  10 clients each day by appointment, can sometimes see walk-in patients if someone does not show for an appointment. [facility]- [address] Ether Griffins [place], [place], [address], [phone] - [facility]- [address] [place], [place], [address], [phone] - [facility]- [phone] [facility] Health Department- [phone] [facility] Department- [phone]

## 2011-09-09 NOTE — ED Notes (Signed)
Patient states that she fell a little over a month ago injuring her right knee. States that she was seen in [facility] ED at that time.  She was told to go home and her condition would improve when the swelling went down. She returns to the ED with C/O increasing pain in her right lower leg and an area of numbness in her right lateral lower leg.

## 2011-09-09 NOTE — ED Provider Notes (Signed)
History     CSN: [id number]  Arrival date & time 09/09/11  2006   First MD Initiated Contact with Patient 09/09/11 2056      Chief Complaint  Patient presents with  . Knee Pain    HPI  History provided by the patient and previous charts. Patient is a 26 year old female with history of asthma, morbid obesity and cholecystectomy who presents with persistent right knee pain and swelling. Patient reports falling at the end of last month has been evaluated in the emergency room for right knee pains. Patient has been to the emergency room twice for these pains with negative x-rays times. Patient states that she has occasionally been resting and elevating her knee but not regularly. Patient was provided crutches but she states she has not used these. Patient has been walking on the knee. Patient has been taking ibuprofen at home without significant improvements. She denies any new injury. There are no other aggravating or alleviating factors. Patient does report some numbness sensations to lateral aspect of the knee and thigh area. This has been unchanged. Denies any other associated symptoms. No weakness or numbness in the foot. No skin changes.    Past Medical History  Diagnosis Date  . Asthma   . Obesity     Past Surgical History  Procedure Date  . Cholecystectomy     Family History  Problem Relation Age of Onset  . Hypertension Mother     History  Substance Use Topics  . Smoking status: Current Everyday Smoker    Types: Cigars  . Smokeless tobacco: Not on file  . Alcohol Use: Yes     Occasional     OB History    Grav Para Term Preterm Abortions TAB SAB Ect Mult Living                  Review of Systems  HENT: Negative for neck pain and neck stiffness.   Musculoskeletal: Positive for joint swelling. Negative for back pain.  Neurological: Positive for numbness. Negative for weakness.    Allergies  Review of patient's allergies indicates no known allergies.  Home  Medications   Current Outpatient Rx  Name Route Sig Dispense Refill  . IBUPROFEN 200 MG PO TABS Oral Take 400 mg by mouth every 6 (six) hours as needed. For pain.      Pulse 100  Temp(Src) 98.5 F (36.9 C) (Oral)  Resp 18  SpO2 97%  LMP 08/24/2011  Physical Exam  Nursing note and vitals reviewed. Constitutional: She is oriented to person, place, and time. She appears well-developed and well-nourished. No distress.  HENT:  Head: Normocephalic.  Cardiovascular: Normal rate and regular rhythm.   Pulmonary/Chest: Effort normal and breath sounds normal.  Abdominal:       Obese  Musculoskeletal:       Knee exam limited due to patient's size and body habitus. There does appear to be positive ballottement of the knee. No significant positive anterior posterior drawer test. No significant laxity with valgus or varus stress. Patient has normal distal sensations and pulses. Patient has full passive range of motion but pain throughout.  Neurological: She is alert and oriented to person, place, and time.  Skin: Skin is warm and dry.  Psychiatric: She has a normal mood and affect. Her behavior is normal.    ED Course  Procedures       1. Knee effusion, right       MDM  Patient seen and evaluated. Patient  no acute distress.   Patient with 2 prior visits for similar complaints. X-rays were both normal at that time. Patient did not have a serious mechanism of fall. Patient reported falling out of her high heel shoes. She did have some twisting to the right knee but states she did not come down hard onto her patella her knee. She denies significant back pains.  Plan to discharge patient with orthopedic referral. Patient encouraged to use crutches with reduced mobility of knee, rest, ice, compression and elevation until she follows up with orthopedic specialist for continued evaluation. Patient instructed she may require MRI for further evaluation and that this was something that can be  done outpatient and not an emergency room.     Angus Seller, [place] 09/09/11 2148

## 2011-09-09 NOTE — ED Notes (Signed)
PT. REPORTS FELL 1 MONTH AGO , SEEN HERE MULTIPLE TIMES , NO FRACTURE , STATES PERSISTENT / CHRONIC  LEFT KNEE PAIN WITH NO RECENT INJURY , AMBULATORY.

## 2011-09-09 NOTE — ED Provider Notes (Signed)
Medical screening examination/treatment/procedure(s) were performed by non-physician practitioner and as supervising physician I was immediately available for consultation/collaboration.  Vasily Fedewa L Aeryn Medici, MD 09/09/11 2316 

## 2011-10-19 ENCOUNTER — Encounter (HOSPITAL_COMMUNITY): Payer: Self-pay | Admitting: *Deleted

## 2011-10-19 ENCOUNTER — Emergency Department (HOSPITAL_COMMUNITY)
Admission: EM | Admit: 2011-10-19 | Discharge: 2011-10-20 | Disposition: A | Discharge status: Home / Self Care | Payer: Self-pay | Attending: Emergency Medicine | Admitting: Emergency Medicine

## 2011-10-19 DIAGNOSIS — F17200 Nicotine dependence, unspecified, uncomplicated: Secondary | ICD-10-CM | POA: Insufficient documentation

## 2011-10-19 DIAGNOSIS — E669 Obesity, unspecified: Secondary | ICD-10-CM | POA: Insufficient documentation

## 2011-10-19 DIAGNOSIS — N390 Urinary tract infection, site not specified: Secondary | ICD-10-CM | POA: Insufficient documentation

## 2011-10-19 DIAGNOSIS — J45909 Unspecified asthma, uncomplicated: Secondary | ICD-10-CM | POA: Insufficient documentation

## 2011-10-19 DIAGNOSIS — R109 Unspecified abdominal pain: Secondary | ICD-10-CM

## 2011-10-19 DIAGNOSIS — R1032 Left lower quadrant pain: Secondary | ICD-10-CM | POA: Insufficient documentation

## 2011-10-19 LAB — COMPREHENSIVE METABOLIC PANEL
ALT: 11 U/L (ref 0–35)
BUN: 10 mg/dL (ref 6–23)
Calcium: 9.3 mg/dL (ref 8.4–10.5)
Chloride: 104 mEq/L (ref 96–112)
Creatinine, Ser: 0.84 mg/dL (ref 0.50–1.10)
GFR calc Af Amer: >90 mL/min (ref >90)
GFR calc non Af Amer: >90 mL/min (ref >90)
Glucose, Bld: 85 mg/dL (ref 70–99)
Potassium: 3.7 mEq/L (ref 3.5–5.1)
Sodium: 135 mEq/L (ref 135–145)

## 2011-10-19 LAB — URINALYSIS, ROUTINE W REFLEX MICROSCOPIC
Glucose, UA: NEGATIVE mg/dL
Nitrite: NEGATIVE
Protein, ur: NEGATIVE mg/dL

## 2011-10-19 LAB — URINE MICROSCOPIC-ADD ON

## 2011-10-19 LAB — CBC WITH DIFFERENTIAL/PLATELET
Basophils Absolute: 0 10*3/uL (ref 0.0–0.1)
Basophils Relative: 0 % (ref 0–1)
Hemoglobin: 12.9 g/dL (ref 12.0–15.0)
MCHC: 33.6 g/dL (ref 30.0–36.0)
Monocytes Relative: 9 % (ref 3–12)
Neutro Abs: 2.4 10*3/uL (ref 1.7–7.7)
Neutrophils Relative %: 39 % — ABNORMAL LOW (ref 43–77)
Platelets: 353 10*3/uL (ref 150–400)
RDW: 12.6 % (ref 11.5–15.5)

## 2011-10-19 LAB — PREGNANCY, URINE: Preg Test, Ur: NEGATIVE

## 2011-10-19 MED ORDER — IBUPROFEN 800 MG PO TABS
800.0000 mg | ORAL_TABLET | Freq: Once | ORAL | Status: AC
  Administered 2011-10-20: 800 mg via ORAL
  Filled 2011-10-19: qty 1

## 2011-10-19 NOTE — ED Provider Notes (Signed)
History     CSN: [id number]  Arrival date & time 10/19/11  1933   First MD Initiated Contact with Patient 10/19/11 2353      Chief Complaint  Patient presents with  . Abdominal Pain    (Consider location/radiation/quality/duration/timing/severity/associated sxs/prior treatment) HPI Comments: Obese young female with history of 5 days of intermittent abdominal pain. She states that she does have urinary frequency and a mid abdominal pain with left lower quadrant pain which is intermittent, worse with standing and sitting and better when she lays down. She does have some pain-free episodes. She denies fevers chills nausea vomiting diarrhea vaginal bleeding or discharge and in fact has missed her last nostril. I approximately 5 days. She took a home pregnancy test which was negative. She denies any back pain, she has had prior surgery including a cholecystectomy which was many years ago.  The history is provided by the patient and a relative.    Past Medical History  Diagnosis Date  . Asthma   . Obesity     Past Surgical History  Procedure Date  . Cholecystectomy     Family History  Problem Relation Age of Onset  . Hypertension Mother     History  Substance Use Topics  . Smoking status: Current Everyday Smoker    Types: Cigars  . Smokeless tobacco: Not on file  . Alcohol Use: Yes     Occasional     OB History    Grav Para Term Preterm Abortions TAB SAB Ect Mult Living                  Review of Systems  All other systems reviewed and are negative.    Allergies  Review of patient's allergies indicates no known allergies.  Home Medications   Current Outpatient Rx  Name Route Sig Dispense Refill  . SULFAMETHOXAZOLE-TRIMETHOPRIM 800-160 MG PO TABS Oral Take 1 tablet by mouth every 12 (twelve) hours. 10 tablet 0    BP 124/74  Pulse 72  Temp 98 F (36.7 C) (Oral)  Resp 16  SpO2 99%  LMP 09/15/2011  Physical Exam  Nursing note and vitals  reviewed. Constitutional: She appears well-developed and well-nourished. No distress.  HENT:  Head: Normocephalic and atraumatic.  Mouth/Throat: Oropharynx is clear and moist. No oropharyngeal exudate.  Eyes: Conjunctivae and EOM are normal. Pupils are equal, round, and reactive to light. Right eye exhibits no discharge. Left eye exhibits no discharge. No scleral icterus.  Neck: Normal range of motion. Neck supple. No JVD present. No thyromegaly present.  Cardiovascular: Normal rate, regular rhythm, normal heart sounds and intact distal pulses.  Exam reveals no gallop and no friction rub.   No murmur heard. Pulmonary/Chest: Effort normal and breath sounds normal. No respiratory distress. She has no wheezes. She has no rales.  Abdominal: Soft. Bowel sounds are normal. She exhibits no distension and no mass. There is tenderness.       Obese abdomen, tenderness in the periumbilical and left lower quadrant, no guarding, non-peritoneal, no pain over McBurney's point.  Musculoskeletal: Normal range of motion. She exhibits no edema and no tenderness.  Lymphadenopathy:    She has no cervical adenopathy.  Neurological: She is alert. Coordination normal.  Skin: Skin is warm and dry. No rash noted. No erythema.  Psychiatric: She has a normal mood and affect. Her behavior is normal.    ED Course  Procedures (including critical care time)  Labs Reviewed  URINALYSIS, ROUTINE W REFLEX MICROSCOPIC -  Abnormal; Notable for the following:    Hgb urine dipstick TRACE (*)     Leukocytes, UA MODERATE (*)     All other components within normal limits  CBC WITH DIFFERENTIAL - Abnormal; Notable for the following:    Neutrophils Relative 39 (*)     Lymphocytes Relative 50 (*)     All other components within normal limits  URINE MICROSCOPIC-ADD ON - Abnormal; Notable for the following:    Squamous Epithelial / LPF FEW (*)     Bacteria, UA FEW (*)     All other components within normal limits  PREGNANCY,  URINE  COMPREHENSIVE METABOLIC PANEL   Dg Abd Acute W/chest  10/20/2011  *RADIOLOGY REPORT*  Clinical Data: Upper abdominal pain for 1 week; nausea.  Abdominal distension and tenderness.  ACUTE ABDOMEN SERIES (ABDOMEN 2 VIEW & CHEST 1 VIEW)  Comparison: Chest radiograph performed 10/06/2010  Findings: The lungs are relatively well-aerated and clear.  There is no evidence of focal opacification, pleural effusion or pneumothorax.  The cardiomediastinal silhouette is within normal limits.  The visualized bowel gas pattern is unremarkable.  Scattered stool and air are seen within the colon; there is no evidence of small bowel dilatation to suggest obstruction.  No free intra-abdominal air is identified on the provided upright view.  No acute osseous abnormalities are seen; the sacroiliac joints are unremarkable in appearance.  Clips are noted within the right upper quadrant, reflecting prior cholecystectomy.  IMPRESSION:  1.  Unremarkable bowel gas pattern; no free intra-abdominal air seen. 2.  No acute cardiopulmonary process identified.  Original Report Authenticated By: Tonia Ghent, M.D.     1. Abdominal pain   2. UTI (urinary tract infection)       MDM  No CVA tenderness, minimal abdominal pain, non-peritoneal and a urinalysis suggesting urinary infection and in the presence of urinary frequency this is a possible source of her pain. Blood counts are normal with a white blood cell count of 6000, compresses metabolic panel which is totally normal including liver function tests intrauterine pregnancy which is negative. Will obtain acute abdominal series to rule out obstruction secondary to the patient's nausea with abdominal pain and prior surgery.  Patient reevaluated, slight improvement after ibuprofen, urinalysis reviewed with the patient showing early infection, patient given return precautions, antibiotics and pain medication prescribed as outpatient. Bactrim given prior to  discharge.  Discharge Prescriptions include:  Bactrim Naprosyn       Vida Roller, MD 10/20/11 0111

## 2011-10-19 NOTE — ED Notes (Signed)
Pt c/o abd pain midepigastric to pubic region.  Denies any vaginal discharge or odor; denies dysuria.  Denies vomiting; nauseated

## 2011-10-20 ENCOUNTER — Emergency Department (HOSPITAL_COMMUNITY): Payer: Self-pay

## 2011-10-20 MED ORDER — NAPROXEN 500 MG PO TABS: 500.0000 mg | ORAL_TABLET | Freq: Two times a day (BID) | ORAL | Status: DC

## 2011-10-20 MED ORDER — SULFAMETHOXAZOLE-TRIMETHOPRIM 800-160 MG PO TABS: 1.0000 | ORAL_TABLET | Freq: Two times a day (BID) | ORAL | Status: AC

## 2011-10-20 MED ORDER — SULFAMETHOXAZOLE-TMP DS 800-160 MG PO TABS
1.0000 | ORAL_TABLET | Freq: Once | ORAL | Status: AC
  Administered 2011-10-20: 1 via ORAL
  Filled 2011-10-20: qty 1

## 2011-11-15 ENCOUNTER — Emergency Department (HOSPITAL_COMMUNITY)
Admission: EM | Admit: 2011-11-15 | Discharge: 2011-11-16 | Disposition: A | Discharge status: Home / Self Care | Payer: Self-pay | Attending: Emergency Medicine | Admitting: Emergency Medicine

## 2011-11-15 DIAGNOSIS — M25561 Pain in right knee: Secondary | ICD-10-CM

## 2011-11-15 DIAGNOSIS — M25569 Pain in unspecified knee: Secondary | ICD-10-CM | POA: Insufficient documentation

## 2011-11-15 DIAGNOSIS — J45909 Unspecified asthma, uncomplicated: Secondary | ICD-10-CM | POA: Insufficient documentation

## 2011-11-15 DIAGNOSIS — F17200 Nicotine dependence, unspecified, uncomplicated: Secondary | ICD-10-CM | POA: Insufficient documentation

## 2011-11-15 DIAGNOSIS — N390 Urinary tract infection, site not specified: Secondary | ICD-10-CM | POA: Insufficient documentation

## 2011-11-15 DIAGNOSIS — J309 Allergic rhinitis, unspecified: Secondary | ICD-10-CM

## 2011-11-16 ENCOUNTER — Emergency Department (HOSPITAL_COMMUNITY): Payer: Self-pay

## 2011-11-16 ENCOUNTER — Encounter (HOSPITAL_COMMUNITY): Payer: Self-pay | Admitting: *Deleted

## 2011-11-16 LAB — URINALYSIS, ROUTINE W REFLEX MICROSCOPIC
Ketones, ur: NEGATIVE mg/dL
Protein, ur: NEGATIVE mg/dL
Urobilinogen, UA: 1 mg/dL (ref 0.0–1.0)

## 2011-11-16 LAB — URINE MICROSCOPIC-ADD ON

## 2011-11-16 LAB — PREGNANCY, URINE: Preg Test, Ur: NEGATIVE

## 2011-11-16 MED ORDER — NITROFURANTOIN MONOHYD MACRO 100 MG PO CAPS
100.0000 mg | ORAL_CAPSULE | Freq: Two times a day (BID) | ORAL | Status: DC
  Administered 2011-11-16: 100 mg via ORAL
  Filled 2011-11-16 (×2): qty 1

## 2011-11-16 MED ORDER — NITROFURANTOIN MONOHYD MACRO 100 MG PO CAPS: 100.0000 mg | ORAL_CAPSULE | Freq: Two times a day (BID) | ORAL | Status: AC

## 2011-11-16 NOTE — ED Notes (Signed)
Pt reports having a UTI a month ago, had her period two weeks agio, and having another UTI approximately 5 days ago. Pt reports blood in urine and lower abdominal pain. Denies nausea, vomiting, burning on urination, vaginal discharge, or vaginal odor. Pt rates abdominal pain 3/10. Friend at the bedside.

## 2011-11-16 NOTE — ED Provider Notes (Signed)
Medical screening examination/treatment/procedure(s) were performed by non-physician practitioner and as supervising physician I was immediately available for consultation/collaboration.   Hanley Seamen, MD 11/16/11 [id number]

## 2011-11-16 NOTE — ED Provider Notes (Signed)
History     CSN: [id number]  Arrival date & time 11/15/11  2339   First MD Initiated Contact with Patient 11/16/11 0310      Chief Complaint  Patient presents with  . Urinary Tract Infection  . Sore Throat    (Consider location/radiation/quality/duration/timing/severity/associated sxs/prior treatment) HPI Comments: Patient states she was treated last month for urinary tract infection for 5 days.  She took 1 pill twice a day.  She is initially, went back, but noticed 34, days ago, that she was having symptoms again.  She also injured her right knee in May had a small knee effusion.  At that time.  She did not follow up with orthopedics to to cost factors, and is still having some discomfort.  She has not reinjured her knee.  She has not noticed any swelling.  She also states, that she has seasonal allergies, and she's noticed, that she's had more congestion and sore throat.  In the last 2-3, weeks  Patient is a 26 y.o. female presenting with urinary tract infection and pharyngitis. The history is provided by the patient.  Urinary Tract Infection This is a recurrent problem. The current episode started yesterday. The problem has been unchanged. Associated symptoms include congestion, joint swelling, a sore throat and urinary symptoms. Pertinent negatives include no abdominal pain, fever, headaches, nausea or rash.  Sore Throat Associated symptoms include congestion, joint swelling, a sore throat and urinary symptoms. Pertinent negatives include no abdominal pain, fever, headaches, nausea or rash.    Past Medical History  Diagnosis Date  . Asthma   . Obesity     Past Surgical History  Procedure Date  . Cholecystectomy     Family History  Problem Relation Age of Onset  . Hypertension Mother     History  Substance Use Topics  . Smoking status: Current Everyday Smoker    Types: Cigars  . Smokeless tobacco: Not on file  . Alcohol Use: Yes     Occasional     OB History    Grav Para Term Preterm Abortions TAB SAB Ect Mult Living                  Review of Systems  Constitutional: Negative for fever.  HENT: Positive for congestion, sore throat and postnasal drip. Negative for rhinorrhea and ear discharge.   Gastrointestinal: Negative for nausea and abdominal pain.  Genitourinary: Negative for vaginal discharge.  Musculoskeletal: Positive for joint swelling.  Skin: Negative for rash and wound.  Neurological: Negative for dizziness and headaches.    Allergies  Review of patient's allergies indicates no known allergies.  Home Medications   Current Outpatient Rx  Name Route Sig Dispense Refill  . NITROFURANTOIN MONOHYD MACRO 100 MG PO CAPS Oral Take 1 capsule (100 mg total) by mouth every 12 (twelve) hours. 10 capsule 0    BP 123/64  Pulse 83  Temp 98.3 F (36.8 C) (Oral)  Resp 18  SpO2 98%  LMP 10/30/2011  Physical Exam  Constitutional: She appears well-developed and well-nourished.  HENT:  Head: Normocephalic.  Right Ear: External ear normal.  Left Ear: External ear normal.  Mouth/Throat: No oropharyngeal exudate.       Chronically enlarged tonsils  Eyes: Pupils are equal, round, and reactive to light.  Neck: Normal range of motion.  Cardiovascular: Normal rate.   Pulmonary/Chest: Effort normal.  Musculoskeletal: Normal range of motion. She exhibits tenderness. She exhibits no edema.       Small amount of  medial right knee, tenderness, without swelling, although is hard to ascertain in this morbidly obese patient  Neurological: She is alert.  Skin: Skin is warm. No erythema.    ED Course  Procedures (including critical care time)  Labs Reviewed  URINALYSIS, ROUTINE W REFLEX MICROSCOPIC - Abnormal; Notable for the following:    Leukocytes, UA MODERATE (*)     All other components within normal limits  URINE MICROSCOPIC-ADD ON - Abnormal; Notable for the following:    Bacteria, UA FEW (*)     All other components within normal  limits  PREGNANCY, URINE   Dg Knee Complete 4 Views Right  11/16/2011  *RADIOLOGY REPORT*  Clinical Data: Prior fall, knee pain  RIGHT KNEE - COMPLETE 4+ VIEW  Comparison: 08/17/2011  Findings: Normal alignment without fracture or effusion.  Preserved joint spaces.  No significant arthropathy.  IMPRESSION: No acute finding.  Stable exam.  Original Report Authenticated By: Judie Petit. Ruel Favors, M.D.     1. UTI (lower urinary tract infection)   2. Allergic rhinitis   3. Knee pain, right       MDM  Will treat UTI culture urine, discussed seasonal allergy treatment as well as knee exercises         Arman Filter, NP 11/16/11 0402  Arman Filter, NP 11/16/11 1610

## 2011-11-16 NOTE — ED Notes (Signed)
Pt c/o pain with urination; pain lower back; treated for uti a month ago; c/o right knee pain--fell in April--numbness noted at knee cap and feels like a "bone sticking out"; pt c/o sore throat

## 2012-06-20 ENCOUNTER — Encounter (HOSPITAL_COMMUNITY): Payer: Self-pay | Admitting: Emergency Medicine

## 2012-06-20 ENCOUNTER — Emergency Department (HOSPITAL_COMMUNITY): Payer: Self-pay

## 2012-06-20 ENCOUNTER — Emergency Department (HOSPITAL_COMMUNITY)
Admission: EM | Admit: 2012-06-20 | Discharge: 2012-06-20 | Disposition: A | Discharge status: Home / Self Care | Payer: Self-pay | Attending: Emergency Medicine | Admitting: Emergency Medicine

## 2012-06-20 DIAGNOSIS — Z87891 Personal history of nicotine dependence: Secondary | ICD-10-CM | POA: Insufficient documentation

## 2012-06-20 DIAGNOSIS — N390 Urinary tract infection, site not specified: Secondary | ICD-10-CM | POA: Insufficient documentation

## 2012-06-20 DIAGNOSIS — R319 Hematuria, unspecified: Secondary | ICD-10-CM | POA: Insufficient documentation

## 2012-06-20 DIAGNOSIS — R110 Nausea: Secondary | ICD-10-CM | POA: Insufficient documentation

## 2012-06-20 DIAGNOSIS — E669 Obesity, unspecified: Secondary | ICD-10-CM | POA: Insufficient documentation

## 2012-06-20 DIAGNOSIS — Z3202 Encounter for pregnancy test, result negative: Secondary | ICD-10-CM | POA: Insufficient documentation

## 2012-06-20 DIAGNOSIS — M549 Dorsalgia, unspecified: Secondary | ICD-10-CM | POA: Insufficient documentation

## 2012-06-20 DIAGNOSIS — J45909 Unspecified asthma, uncomplicated: Secondary | ICD-10-CM | POA: Insufficient documentation

## 2012-06-20 DIAGNOSIS — R109 Unspecified abdominal pain: Secondary | ICD-10-CM

## 2012-06-20 LAB — URINALYSIS, ROUTINE W REFLEX MICROSCOPIC
Bilirubin Urine: NEGATIVE
Glucose, UA: NEGATIVE mg/dL
Hgb urine dipstick: NEGATIVE
Ketones, ur: NEGATIVE mg/dL
Specific Gravity, Urine: 1.022 (ref 1.005–1.030)
pH: 8 (ref 5.0–8.0)

## 2012-06-20 LAB — CBC WITH DIFFERENTIAL/PLATELET
Basophils Relative: 0 % (ref 0–1)
HCT: 37.4 % (ref 36.0–46.0)
Hemoglobin: 12.8 g/dL (ref 12.0–15.0)
Lymphs Abs: 3 10*3/uL (ref 0.7–4.0)
MCHC: 34.2 g/dL (ref 30.0–36.0)
Monocytes Absolute: 0.5 10*3/uL (ref 0.1–1.0)
Monocytes Relative: 7 % (ref 3–12)
Neutro Abs: 2.7 10*3/uL (ref 1.7–7.7)
RBC: 4.25 MIL/uL (ref 3.87–5.11)

## 2012-06-20 LAB — BASIC METABOLIC PANEL
BUN: 7 mg/dL (ref 6–23)
CO2: 24 mEq/L (ref 19–32)
Chloride: 104 mEq/L (ref 96–112)
Creatinine, Ser: 0.76 mg/dL (ref 0.50–1.10)
GFR calc Af Amer: >90 mL/min (ref >90)
Glucose, Bld: 104 mg/dL — ABNORMAL HIGH (ref 70–99)
Potassium: 3.9 mEq/L (ref 3.5–5.1)

## 2012-06-20 LAB — URINE MICROSCOPIC-ADD ON

## 2012-06-20 MED ORDER — SODIUM CHLORIDE 0.9 % IV BOLUS (SEPSIS)
1000.0000 mL | Freq: Once | INTRAVENOUS | Status: AC
  Administered 2012-06-20: 1000 mL via INTRAVENOUS

## 2012-06-20 MED ORDER — IOHEXOL 300 MG/ML  SOLN
50.0000 mL | Freq: Once | INTRAMUSCULAR | Status: AC | PRN
  Administered 2012-06-20: 50 mL via ORAL

## 2012-06-20 MED ORDER — OXYCODONE-ACETAMINOPHEN 5-325 MG PO TABS: 1.0000 | ORAL_TABLET | Freq: Four times a day (QID) | ORAL | Status: DC | PRN

## 2012-06-20 MED ORDER — IOHEXOL 300 MG/ML  SOLN
100.0000 mL | Freq: Once | INTRAMUSCULAR | Status: AC | PRN
  Administered 2012-06-20: 100 mL via INTRAVENOUS

## 2012-06-20 MED ORDER — PROMETHAZINE HCL 25 MG PO TABS: 25.0000 mg | ORAL_TABLET | Freq: Four times a day (QID) | ORAL | Status: DC | PRN

## 2012-06-20 MED ORDER — SULFAMETHOXAZOLE-TRIMETHOPRIM 800-160 MG PO TABS: 1.0000 | ORAL_TABLET | Freq: Two times a day (BID) | ORAL | Status: DC

## 2012-06-20 NOTE — ED Notes (Signed)
Patient transported to CT 

## 2012-06-20 NOTE — ED Notes (Signed)
Onset two weeks ago general abdominal pain, lower back pain, and hematuria intermittent.  Lightheaded dizziness onset 4 days ago intermittent.

## 2012-06-20 NOTE — ED Provider Notes (Signed)
History     CSN: [id number]  Arrival date & time 06/20/12  1253   First MD Initiated Contact with Patient 06/20/12 1709      Chief Complaint  Patient presents with  . Abdominal Pain  . Hematuria  . Back Pain    (Consider location/radiation/quality/duration/timing/severity/associated sxs/prior treatment) Patient is a 27 y.o. female presenting with abdominal pain, hematuria, and back pain. The history is provided by the patient.  Abdominal Pain Associated symptoms: hematuria and nausea   Associated symptoms: no chest pain, no cough, no diarrhea, no fatigue, no shortness of breath and no vomiting   Hematuria Associated symptoms include abdominal pain. Pertinent negatives include no chest pain, no headaches and no shortness of breath.  Back Pain Associated symptoms: abdominal pain   Associated symptoms: no chest pain, no headaches, no numbness and no weakness    patient presents with lower abdominal pain has been going on for last 2 weeks. She also states she's had some blood in the urine. The pain is dull. She's had urinary frequency also. She denies vaginal bleeding or discharge. No fevers. She has had some nausea and vomiting. No blood in the stool. No diarrhea. She's not had pains at this before. She denies possibility of pregnancy.  Past Medical History  Diagnosis Date  . Asthma   . Obesity     Past Surgical History  Procedure Laterality Date  . Cholecystectomy      Family History  Problem Relation Age of Onset  . Hypertension Mother     History  Substance Use Topics  . Smoking status: Former Smoker    Types: Cigars  . Smokeless tobacco: Not on file  . Alcohol Use: Yes     Comment: Occasional     OB History   Grav Para Term Preterm Abortions TAB SAB Ect Mult Living                  Review of Systems  Constitutional: Negative for activity change, appetite change and fatigue.  HENT: Negative for neck stiffness.   Eyes: Negative for pain.  Respiratory:  Negative for cough, chest tightness and shortness of breath.   Cardiovascular: Negative for chest pain and leg swelling.  Gastrointestinal: Positive for nausea and abdominal pain. Negative for vomiting and diarrhea.  Genitourinary: Positive for hematuria. Negative for flank pain.  Musculoskeletal: Positive for back pain.  Skin: Negative for rash.  Neurological: Negative for weakness, numbness and headaches.  Psychiatric/Behavioral: Negative for behavioral problems.    Allergies  Review of patient's allergies indicates no known allergies.  Home Medications   Current Outpatient Rx  Name  Route  Sig  Dispense  Refill  . DIGESTIVE ENZYMES PO   Oral   Take 1 tablet by mouth daily as needed. For digestive problems           BP 101/49  Pulse 78  Temp(Src) 98.1 F (36.7 C) (Oral)  Resp 19  SpO2 100%  Physical Exam  Nursing note and vitals reviewed. Constitutional: She is oriented to person, place, and time. She appears well-developed and well-nourished.  Patient is obese  HENT:  Head: Normocephalic and atraumatic.  Eyes: EOM are normal. Pupils are equal, round, and reactive to light.  Neck: Normal range of motion. Neck supple.  Cardiovascular: Normal rate, regular rhythm and normal heart sounds.   No murmur heard. Pulmonary/Chest: Effort normal and breath sounds normal. No respiratory distress. She has no wheezes. She has no rales.  Abdominal: Soft. Bowel sounds  are normal. She exhibits no distension. There is tenderness. There is no rebound and no guarding.  Lower abdominal and suprapubic tenderness without rebound or guarding. No hernias palpated  Musculoskeletal: Normal range of motion.  Neurological: She is alert and oriented to person, place, and time. No cranial nerve deficit.  Skin: Skin is warm and dry.  Psychiatric: She has a normal mood and affect. Her speech is normal.    ED Course  Procedures (including critical care time)  Labs Reviewed  CBC WITH  DIFFERENTIAL - Abnormal; Notable for the following:    Lymphocytes Relative 47 (*)    All other components within normal limits  BASIC METABOLIC PANEL - Abnormal; Notable for the following:    Glucose, Bld 104 (*)    All other components within normal limits  URINALYSIS, ROUTINE W REFLEX MICROSCOPIC - Abnormal; Notable for the following:    APPearance CLOUDY (*)    Leukocytes, UA SMALL (*)    All other components within normal limits  URINE MICROSCOPIC-ADD ON - Abnormal; Notable for the following:    Squamous Epithelial / LPF MANY (*)    Bacteria, UA FEW (*)    All other components within normal limits  URINE CULTURE  POCT PREGNANCY, URINE   No results found.   No diagnosis found.    MDM  Patient with abdominal pain. Also dysuria but does not appear to have urinary tract infection. Lab work is overall reassuring the patient has continued pain we'll get a CT of the abdomen.        Juliet Rude. Rubin Payor, MD 06/20/12 2018

## 2012-06-22 LAB — URINE CULTURE

## 2012-07-21 ENCOUNTER — Emergency Department (INDEPENDENT_AMBULATORY_CARE_PROVIDER_SITE_OTHER)
Admission: EM | Admit: 2012-07-21 | Discharge: 2012-07-21 | Disposition: A | Discharge status: Home / Self Care | Payer: Self-pay | Source: Home / Self Care

## 2012-07-21 ENCOUNTER — Encounter (HOSPITAL_COMMUNITY): Payer: Self-pay | Admitting: *Deleted

## 2012-07-21 DIAGNOSIS — R05 Cough: Secondary | ICD-10-CM

## 2012-07-21 DIAGNOSIS — R059 Cough, unspecified: Secondary | ICD-10-CM

## 2012-07-21 DIAGNOSIS — J302 Other seasonal allergic rhinitis: Secondary | ICD-10-CM

## 2012-07-21 DIAGNOSIS — J309 Allergic rhinitis, unspecified: Secondary | ICD-10-CM

## 2012-07-21 DIAGNOSIS — J45909 Unspecified asthma, uncomplicated: Secondary | ICD-10-CM

## 2012-07-21 MED ORDER — ALBUTEROL SULFATE HFA 108 (90 BASE) MCG/ACT IN AERS: 2.0000 | INHALATION_SPRAY | Freq: Four times a day (QID) | RESPIRATORY_TRACT | Status: DC | PRN

## 2012-07-21 MED ORDER — FLUTICASONE PROPIONATE 50 MCG/ACT NA SUSP: 2.0000 | Freq: Every day | NASAL | Status: DC

## 2012-07-21 MED ORDER — GUAIFENESIN-CODEINE 100-10 MG/5ML PO SYRP: 5.0000 mL | ORAL_SOLUTION | Freq: Three times a day (TID) | ORAL | Status: DC | PRN

## 2012-07-21 NOTE — ED Provider Notes (Signed)
Medical screening examination/treatment/procedure(s) were performed by resident physician or non-physician practitioner and as supervising physician I was immediately available for consultation/collaboration.   Margia Wiesen DOUGLAS MD.   Jacalyn Biggs D Hodges Treiber, MD 07/21/12 1955 

## 2012-07-21 NOTE — ED Provider Notes (Signed)
Diane Wong is a 27 y.o. female who presents to Urgent Care today for cough, congestion, sore throat, hoarseness starting yesterday. Patient has a past history significant for asthma and allergies. She is currently taking albuterol and Zyrtec which help a bit. Additionally she notes that NyQuil at night has helped. No shortness of breath wheeze fevers chills nausea vomiting or diarrhea. No sick contacts.   PMH reviewed. Obesity and asthma and seasonal allergies History  Substance Use Topics  . Smoking status: Former Smoker    Types: Cigars  . Smokeless tobacco: Not on file  . Alcohol Use: Yes     Comment: Occasional    ROS as above Medications reviewed. No current facility-administered medications for this encounter.   Current Outpatient Prescriptions  Medication Sig Dispense Refill  . albuterol (PROVENTIL HFA;VENTOLIN HFA) 108 (90 BASE) MCG/ACT inhaler Inhale 2 puffs into the lungs every 6 (six) hours as needed for wheezing.  1 Inhaler  2  . fluticasone (FLONASE) 50 MCG/ACT nasal spray Place 2 sprays into the nose daily.  16 g  2  . guaiFENesin-codeine (ROBITUSSIN AC) 100-10 MG/5ML syrup Take 5 mLs by mouth 3 (three) times daily as needed for cough.  120 mL  0  . [DISCONTINUED] promethazine (PHENERGAN) 25 MG tablet Take 1 tablet (25 mg total) by mouth every 6 (six) hours as needed for nausea.  10 tablet  0    Exam:  BP 121/80  Pulse 99  Temp(Src) 97.7 F (36.5 C) (Oral)  Resp 16  SpO2 100% Gen: Well NAD HEENT: EOMI,  MMM, normal tympanic membranes bilaterally. Injected nasal turbinates. Posterior pharynx with large tonsils with cobblestoning.  Lungs: CTABL Nl WOB Heart: RRR no MRG Abd: NABS, NT, ND Exts: Non edematous BL  LE, warm and well perfused.   No results found for this or any previous visit (from the past 24 hour(s)). No results found.  Assessment and Plan: 27 y.o. female with viral URI versus seasonal allergies.  Symptomatic treatment with codeine containing  cough medicine, and Aleve.  She allergies a Flonase nasal spray and refill albuterol inhaler. Recommend patient followup with primary care provider for controller medication for asthma.  Discussed warning signs or symptoms. Please see discharge instructions. Patient expresses understanding.      Rodolph Bong, MD 07/21/12 [phone]

## 2012-07-21 NOTE — ED Notes (Signed)
C/O sore throat, dry cough x 3 days.  Has used albuterol inhaler x 3 days.  Denies fevers.

## 2012-08-30 IMAGING — CR DG TIBIA/FIBULA 2V*R*
4 series · 4 of 4 positions shown · non-contrast
Comparison: None.

CLINICAL DATA: Fall.  Anterior leg pain.

RIGHT TIBIA AND FIBULA - 2 VIEW

[t tib/fib ap right (1 of 2)]
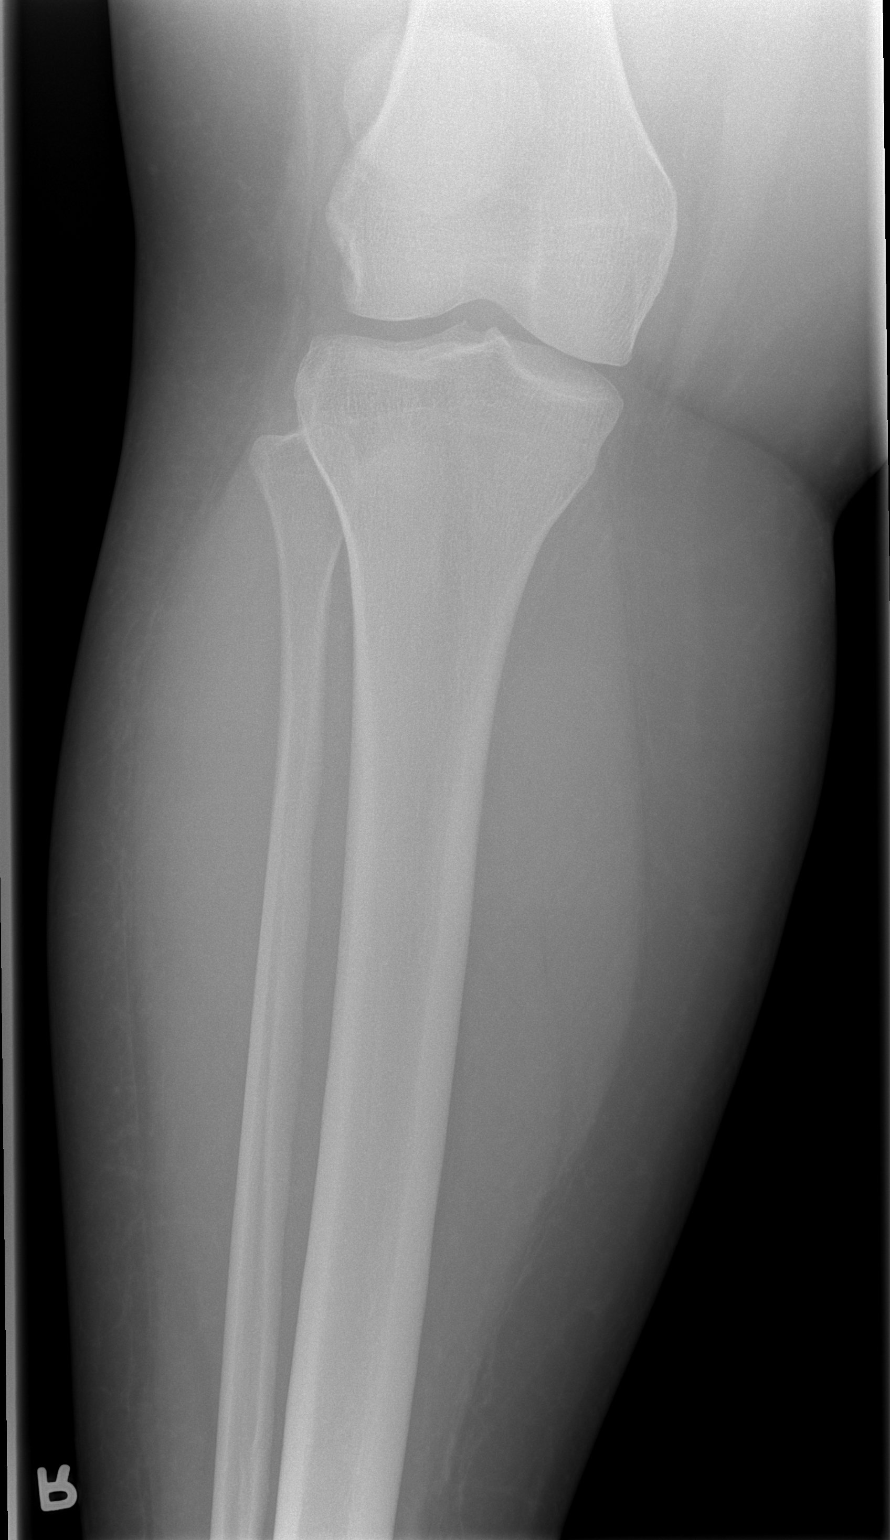

[t tib/fib ap right (2 of 2)]
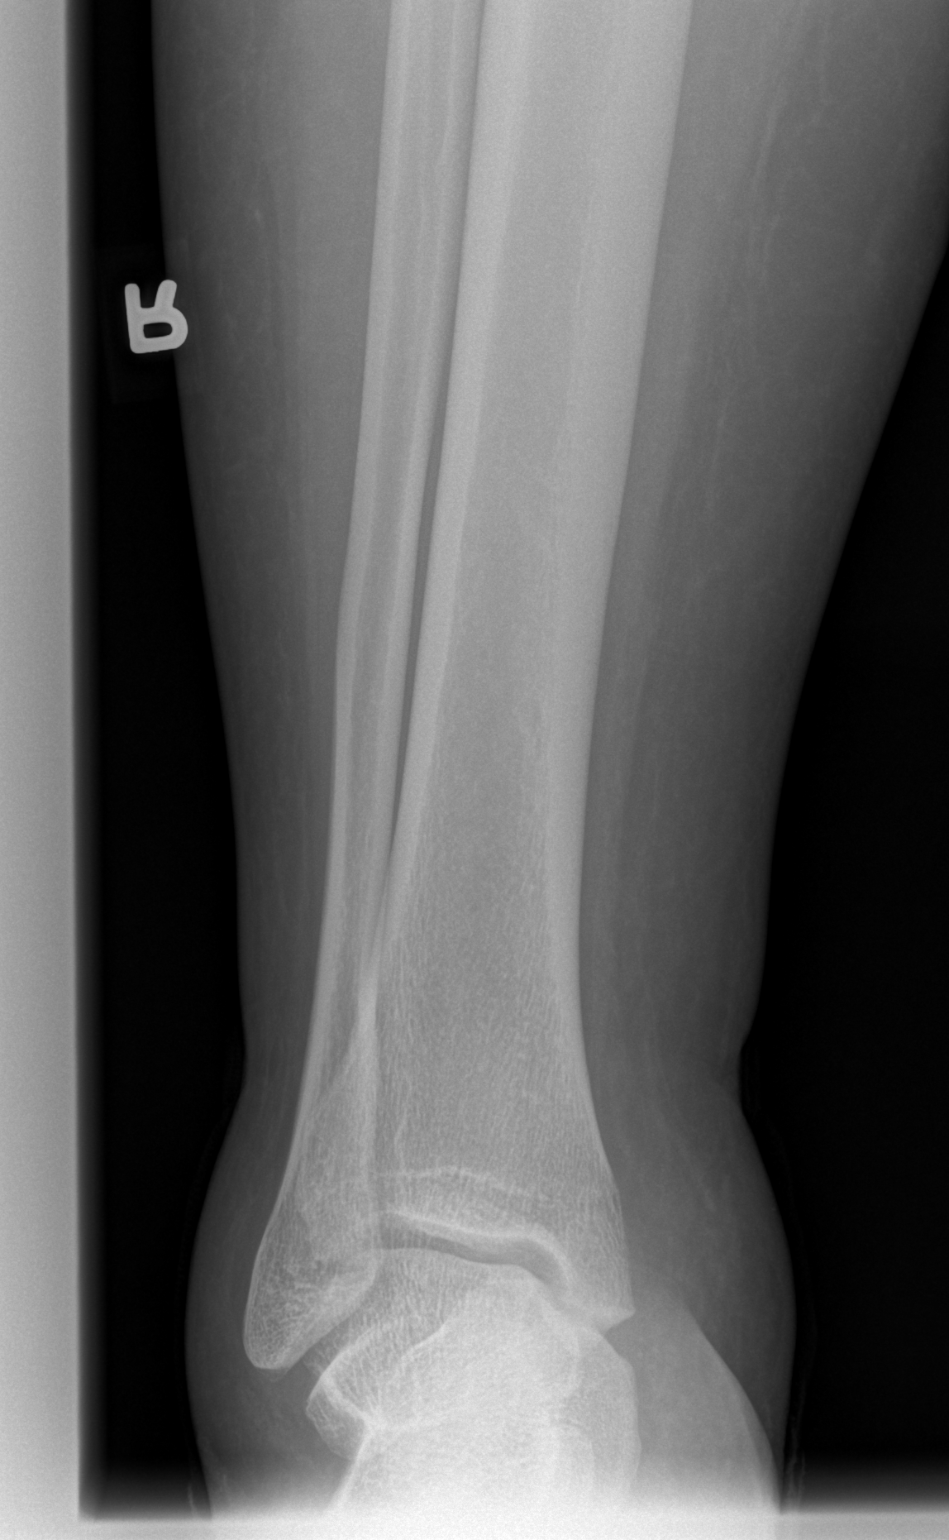

[t tib/fib lat right (1 of 2)]
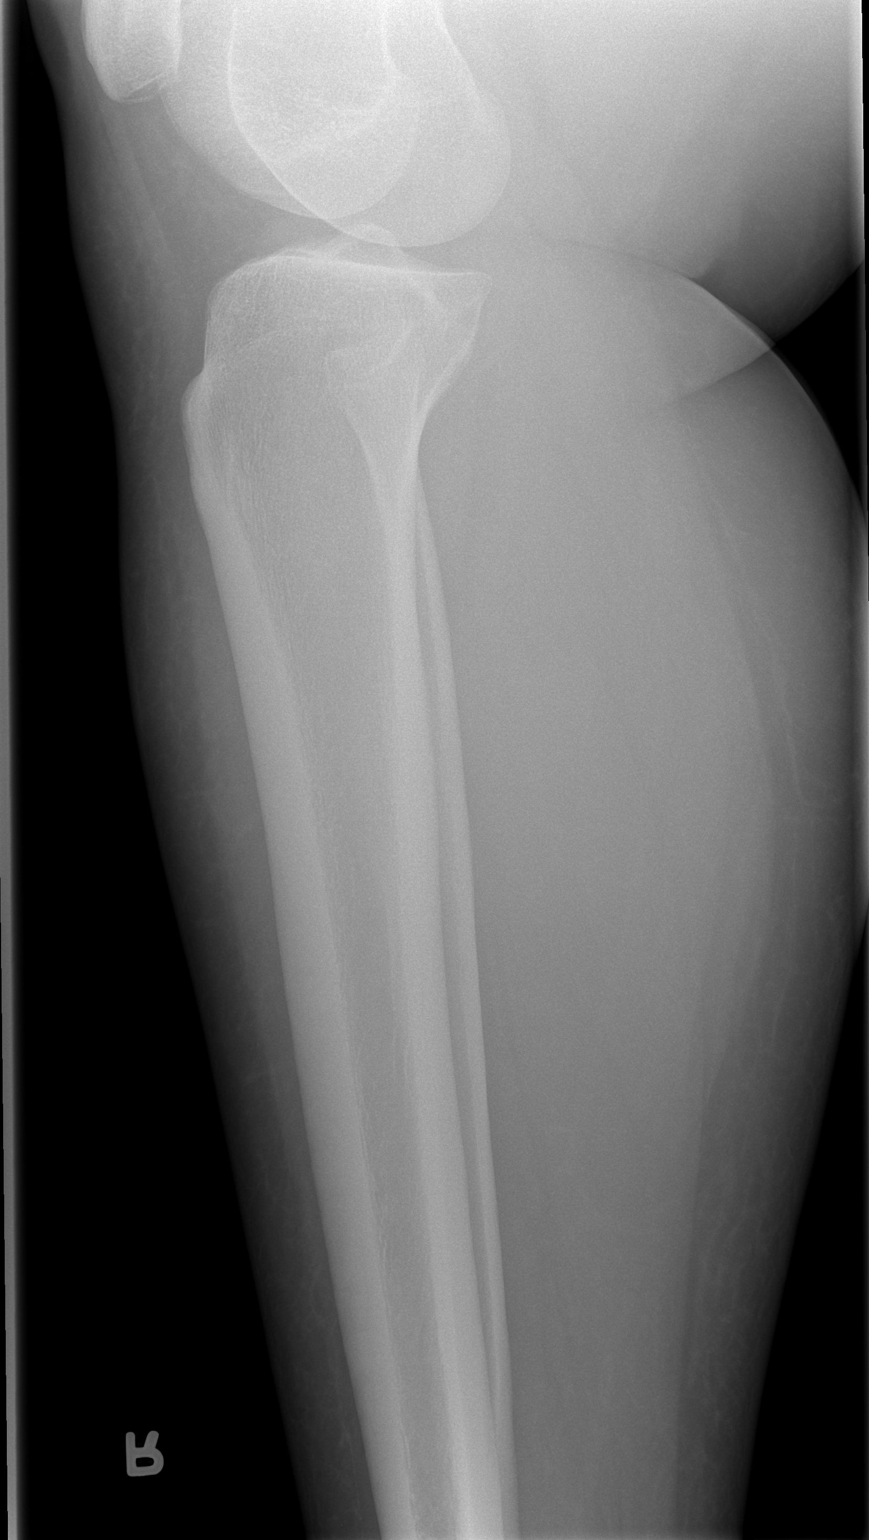

[t tib/fib lat right (2 of 2)]
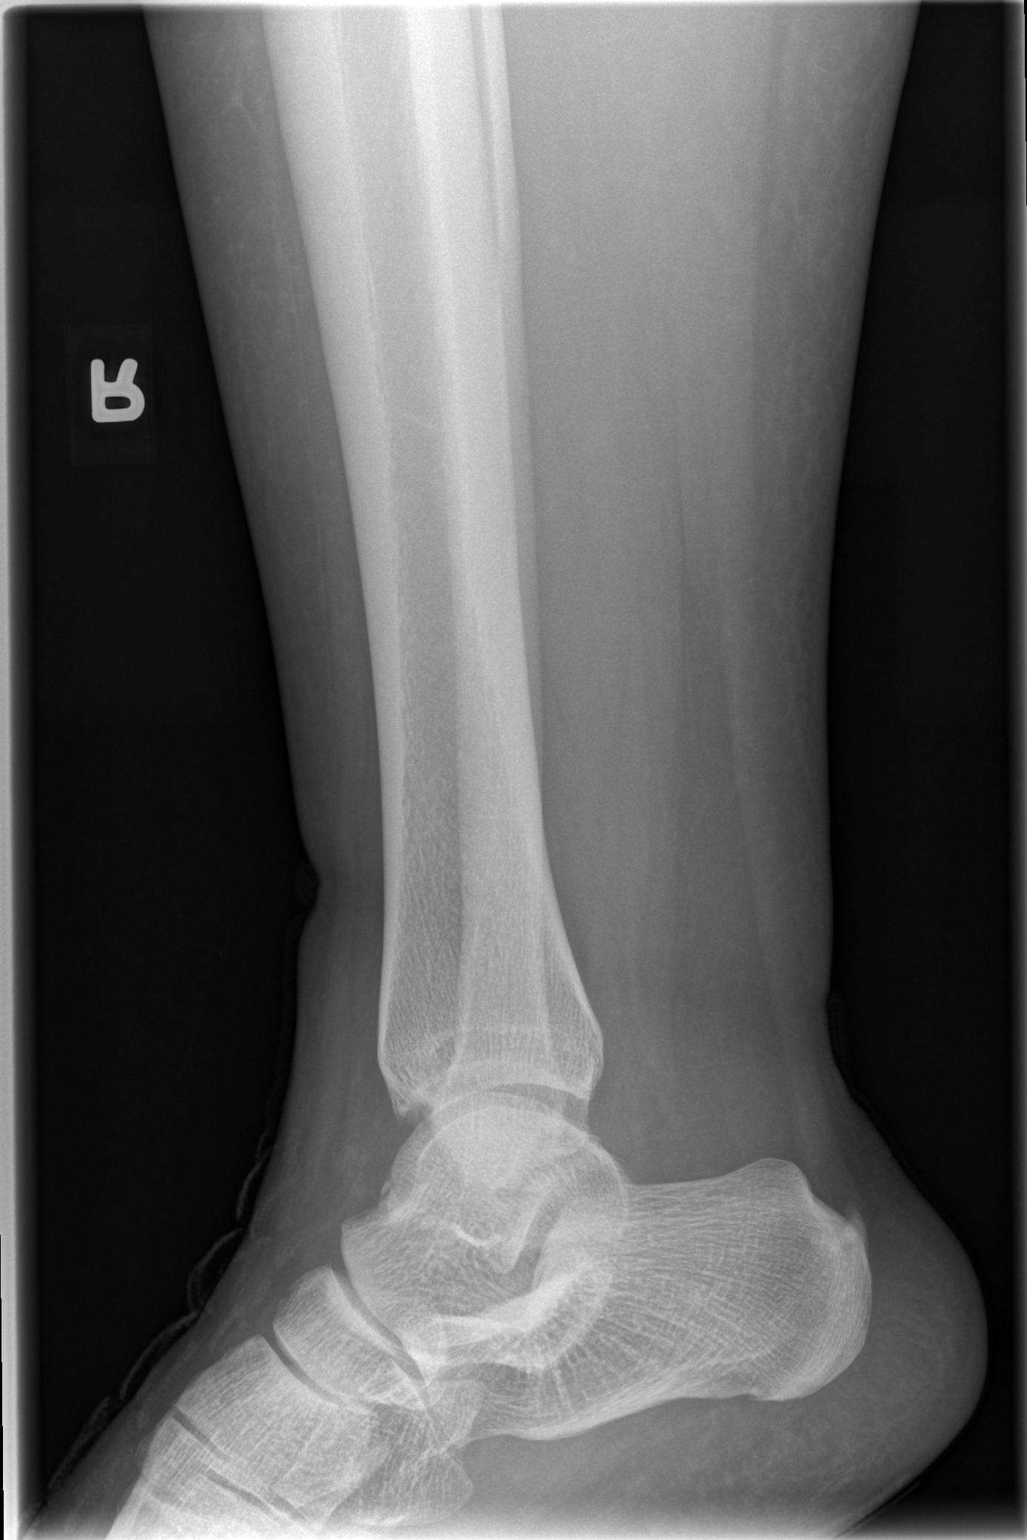

[4 of 4 positions shown; findings below may reference images not displayed]

FINDINGS: No fracture, foreign body, or acute bony findings are
identified.  Achilles calcaneal spur noted.
IMPRESSION: 1.  No acute bony findings.
2.  Achilles calcaneal spur.

## 2012-12-21 ENCOUNTER — Emergency Department (INDEPENDENT_AMBULATORY_CARE_PROVIDER_SITE_OTHER)
Admission: EM | Admit: 2012-12-21 | Discharge: 2012-12-21 | Disposition: A | Discharge status: Home / Self Care | Payer: Self-pay | Source: Home / Self Care | Attending: Family Medicine | Admitting: Family Medicine

## 2012-12-21 ENCOUNTER — Encounter (HOSPITAL_COMMUNITY): Payer: Self-pay | Admitting: Emergency Medicine

## 2012-12-21 DIAGNOSIS — J020 Streptococcal pharyngitis: Secondary | ICD-10-CM

## 2012-12-21 MED ORDER — AMOXICILLIN 500 MG PO CAPS: 500.0000 mg | ORAL_CAPSULE | Freq: Three times a day (TID) | ORAL | Status: DC

## 2012-12-21 NOTE — ED Provider Notes (Signed)
CSN: [id number]     Arrival date & time 12/21/12  1408 History   First MD Initiated Contact with Patient 12/21/12 1427     Chief Complaint  Patient presents with  . Sore Throat   (Consider location/radiation/quality/duration/timing/severity/associated sxs/prior Treatment) Patient is a 27 y.o. female presenting with pharyngitis. The history is provided by the patient.  Sore Throat This is a new problem. The current episode started yesterday. The problem has been gradually worsening. Pertinent negatives include no chest pain, no abdominal pain and no headaches. The symptoms are aggravated by swallowing.    Past Medical History  Diagnosis Date  . Asthma   . Obesity    Past Surgical History  Procedure Laterality Date  . Cholecystectomy     Family History  Problem Relation Age of Onset  . Hypertension Mother    History  Substance Use Topics  . Smoking status: Former Smoker    Types: Cigars  . Smokeless tobacco: Not on file  . Alcohol Use: Yes     Comment: Occasional    OB History   Grav Para Term Preterm Abortions TAB SAB Ect Mult Living                 Review of Systems  Constitutional: Negative.   HENT: Positive for sore throat.   Cardiovascular: Negative for chest pain.  Gastrointestinal: Negative.  Negative for abdominal pain.  Skin: Negative.   Neurological: Negative for headaches.    Allergies  Review of patient's allergies indicates no known allergies.  Home Medications   Current Outpatient Rx  Name  Route  Sig  Dispense  Refill  . albuterol (PROVENTIL HFA;VENTOLIN HFA) 108 (90 BASE) MCG/ACT inhaler   Inhalation   Inhale 2 puffs into the lungs every 6 (six) hours as needed for wheezing.   1 Inhaler   2   . ALBUTEROL IN   Inhalation   Inhale into the lungs.         Marland Kitchen amoxicillin (AMOXIL) 500 MG capsule   Oral   Take 1 capsule (500 mg total) by mouth 3 (three) times daily.   30 capsule   0   . fluticasone (FLONASE) 50 MCG/ACT nasal spray  Nasal   Place 2 sprays into the nose daily.   16 g   2   . guaiFENesin-codeine (ROBITUSSIN AC) 100-10 MG/5ML syrup   Oral   Take 5 mLs by mouth 3 (three) times daily as needed for cough.   120 mL   0    BP 123/84  Pulse 85  Temp(Src) 99.4 F (37.4 C) (Oral)  Resp 16  SpO2 98%  LMP 12/20/2012 Physical Exam  Nursing note and vitals reviewed. Constitutional: She is oriented to person, place, and time. She appears well-developed and well-nourished.  HENT:  Head: Normocephalic.  Right Ear: External ear normal.  Left Ear: External ear normal.  Mouth/Throat: Uvula is midline and mucous membranes are normal. Oropharyngeal exudate and posterior oropharyngeal erythema present. No tonsillar abscesses.  Eyes: Pupils are equal, round, and reactive to light.  Neck: Normal range of motion. Neck supple.  Cardiovascular: Regular rhythm.   Lymphadenopathy:    She has cervical adenopathy.  Neurological: She is alert and oriented to person, place, and time.  Skin: Skin is warm and dry.    ED Course  Procedures (including critical care time) Labs Review Labs Reviewed - No data to display Imaging Review No results found.  MDM      Linna Hoff, MD  12/21/12 2049 

## 2012-12-21 NOTE — ED Notes (Signed)
Pt c/o ST onset 2 days Sxs also include: odynophagia, left ear pain and chills, and BA Denies: f/v/n/d Alert w/no signs of acute distress

## 2013-02-22 ENCOUNTER — Emergency Department (HOSPITAL_COMMUNITY)
Admission: EM | Admit: 2013-02-22 | Discharge: 2013-02-22 | Disposition: A | Discharge status: Home / Self Care | Payer: Self-pay | Attending: Emergency Medicine | Admitting: Emergency Medicine

## 2013-02-22 ENCOUNTER — Encounter (HOSPITAL_COMMUNITY): Payer: Self-pay | Admitting: Emergency Medicine

## 2013-02-22 DIAGNOSIS — E669 Obesity, unspecified: Secondary | ICD-10-CM | POA: Insufficient documentation

## 2013-02-22 DIAGNOSIS — Z87891 Personal history of nicotine dependence: Secondary | ICD-10-CM | POA: Insufficient documentation

## 2013-02-22 DIAGNOSIS — J45909 Unspecified asthma, uncomplicated: Secondary | ICD-10-CM | POA: Insufficient documentation

## 2013-02-22 DIAGNOSIS — Z3202 Encounter for pregnancy test, result negative: Secondary | ICD-10-CM | POA: Insufficient documentation

## 2013-02-22 DIAGNOSIS — N390 Urinary tract infection, site not specified: Secondary | ICD-10-CM | POA: Insufficient documentation

## 2013-02-22 DIAGNOSIS — J029 Acute pharyngitis, unspecified: Secondary | ICD-10-CM | POA: Insufficient documentation

## 2013-02-22 DIAGNOSIS — R109 Unspecified abdominal pain: Secondary | ICD-10-CM | POA: Insufficient documentation

## 2013-02-22 DIAGNOSIS — R112 Nausea with vomiting, unspecified: Secondary | ICD-10-CM | POA: Insufficient documentation

## 2013-02-22 LAB — CBC WITH DIFFERENTIAL/PLATELET
Eosinophils Absolute: 0.2 10*3/uL (ref 0.0–0.7)
Eosinophils Relative: 3 % (ref 0–5)
Hemoglobin: 13.2 g/dL (ref 12.0–15.0)
Lymphocytes Relative: 51 % — ABNORMAL HIGH (ref 12–46)
Lymphs Abs: 3.2 10*3/uL (ref 0.7–4.0)
MCH: 30.1 pg (ref 26.0–34.0)
MCV: 89.3 fL (ref 78.0–100.0)
Monocytes Relative: 11 % (ref 3–12)
Platelets: 388 10*3/uL (ref 150–400)
RBC: 4.38 MIL/uL (ref 3.87–5.11)
WBC: 6.2 10*3/uL (ref 4.0–10.5)

## 2013-02-22 LAB — URINALYSIS, ROUTINE W REFLEX MICROSCOPIC
Bilirubin Urine: NEGATIVE
Ketones, ur: NEGATIVE mg/dL
Nitrite: NEGATIVE
Protein, ur: NEGATIVE mg/dL
Urobilinogen, UA: 1 mg/dL (ref 0.0–1.0)

## 2013-02-22 LAB — COMPREHENSIVE METABOLIC PANEL
ALT: 15 U/L (ref 0–35)
Alkaline Phosphatase: 81 U/L (ref 39–117)
BUN: 9 mg/dL (ref 6–23)
CO2: 24 mEq/L (ref 19–32)
Calcium: 9.4 mg/dL (ref 8.4–10.5)
GFR calc Af Amer: >90 mL/min (ref >90)
GFR calc non Af Amer: >90 mL/min (ref >90)
Glucose, Bld: 104 mg/dL — ABNORMAL HIGH (ref 70–99)
Potassium: 3.9 mEq/L (ref 3.5–5.1)
Sodium: 139 mEq/L (ref 135–145)
Total Protein: 7.2 g/dL (ref 6.0–8.3)

## 2013-02-22 LAB — URINE MICROSCOPIC-ADD ON

## 2013-02-22 MED ORDER — ONDANSETRON HCL 4 MG PO TABS: 4.0000 mg | ORAL_TABLET | Freq: Four times a day (QID) | ORAL | Status: DC

## 2013-02-22 MED ORDER — ASPIRIN-ACETAMINOPHEN-CAFFEINE 250-250-65 MG PO TABS
1.0000 | ORAL_TABLET | Freq: Once | ORAL | Status: AC
  Administered 2013-02-22: 1 via ORAL
  Filled 2013-02-22: qty 1

## 2013-02-22 MED ORDER — ONDANSETRON 4 MG PO TBDP
4.0000 mg | ORAL_TABLET | Freq: Once | ORAL | Status: AC
  Administered 2013-02-22: 4 mg via ORAL
  Filled 2013-02-22: qty 1

## 2013-02-22 MED ORDER — CIPROFLOXACIN HCL 500 MG PO TABS
500.0000 mg | ORAL_TABLET | Freq: Once | ORAL | Status: AC
  Administered 2013-02-22: 500 mg via ORAL
  Filled 2013-02-22: qty 1

## 2013-02-22 MED ORDER — CIPROFLOXACIN HCL 500 MG PO TABS: 500.0000 mg | ORAL_TABLET | Freq: Two times a day (BID) | ORAL | Status: DC

## 2013-02-22 NOTE — ED Provider Notes (Signed)
CSN: [id number]     Arrival date & time 02/22/13  1730 History   First MD Initiated Contact with Patient 02/22/13 1744     This chart was scribed for Marlon Pel, by Ladona Ridgel Day, ED scribe. This patient was seen in room WTR7/WTR7 and the patient's care was started at 1744.  Chief Complaint  Patient presents with  . Emesis  . Headache   The history is provided by the patient. No language interpreter was used.   HPI Comments: Diane Wong is a 27 y.o. female who presents to the Emergency Department complaining of emesis episodes and umbilical abdominal pain, both onset 2 days ago. She states approximately 4 emesis episodes per day with no specific precipitation besides abdominal discomfort and nausea. She reports mild sore throat which she attributes to emesis episodes. She also reports 2 days of migraines. She denies any similar previous episodes. She denies associated cough, vaginal d/c or bleeding, dysuria and no hematuria. She states no chance of pregnancy. LNMP October 30th.   Past Medical History  Diagnosis Date  . Asthma   . Obesity    Past Surgical History  Procedure Laterality Date  . Cholecystectomy     Family History  Problem Relation Age of Onset  . Hypertension Mother    History  Substance Use Topics  . Smoking status: Former Smoker    Types: Cigars  . Smokeless tobacco: Not on file  . Alcohol Use: Yes     Comment: Occasional    OB History   Grav Para Term Preterm Abortions TAB SAB Ect Mult Living                 Review of Systems  Constitutional: Negative for fever and chills.  HENT: Positive for sore throat.   Respiratory: Negative for shortness of breath.   Gastrointestinal: Positive for nausea, vomiting and abdominal pain.  Genitourinary: Negative for dysuria, vaginal bleeding and vaginal discharge.  Neurological: Positive for headaches. Negative for weakness.   A complete 10 system review of systems was obtained and all systems are negative except  as noted in the HPI and PMH.   Allergies  Review of patient's allergies indicates no known allergies.  Home Medications   Current Outpatient Rx  Name  Route  Sig  Dispense  Refill  . ciprofloxacin (CIPRO) 500 MG tablet   Oral   Take 1 tablet (500 mg total) by mouth 2 (two) times daily.   28 tablet   0   . ondansetron (ZOFRAN) 4 MG tablet   Oral   Take 1 tablet (4 mg total) by mouth every 6 (six) hours.   20 tablet   0    Triage Vitals: BP 145/64  Pulse 80  Temp(Src) 98.4 F (36.9 C) (Oral)  Resp 18  SpO2 99%  LMP 02/08/2013 Physical Exam  Nursing note and vitals reviewed. Constitutional: She is oriented to person, place, and time. She appears well-developed and well-nourished. No distress.  HENT:  Head: Normocephalic and atraumatic.  Eyes: EOM are normal.  Neck: Neck supple. No tracheal deviation present.  Cardiovascular: Normal rate.   Pulmonary/Chest: Effort normal. No respiratory distress.  Musculoskeletal: Normal range of motion.  Neurological: She is alert and oriented to person, place, and time.  Skin: Skin is warm and dry.  Psychiatric: She has a normal mood and affect. Her behavior is normal.    ED Course  Procedures (including critical care time) DIAGNOSTIC STUDIES: Oxygen Saturation is 99% on room air, normal by  my interpretation.    COORDINATION OF CARE: At 600 PM Discussed treatment plan with patient which includes zofran, UA, blood work. Patient agrees.   Patients labs show that she has UTI, otherwise labs are stable. Rx: Zofran and Cipro  Labs Review Labs Reviewed  CBC WITH DIFFERENTIAL - Abnormal; Notable for the following:    Neutrophils Relative % 35 (*)    Lymphocytes Relative 51 (*)    All other components within normal limits  COMPREHENSIVE METABOLIC PANEL - Abnormal; Notable for the following:    Glucose, Bld 104 (*)    All other components within normal limits  URINALYSIS, ROUTINE W REFLEX MICROSCOPIC - Abnormal; Notable for the  following:    APPearance CLOUDY (*)    Specific Gravity, Urine 1.031 (*)    Hgb urine dipstick TRACE (*)    Leukocytes, UA MODERATE (*)    All other components within normal limits  URINE MICROSCOPIC-ADD ON - Abnormal; Notable for the following:    Squamous Epithelial / LPF FEW (*)    All other components within normal limits  LIPASE, BLOOD  PREGNANCY, URINE   Imaging Review No results found.  EKG Interpretation   None       MDM   1. UTI (lower urinary tract infection)    27 y.o.Diane Wong's evaluation in the Emergency Department is complete. It has been determined that no acute conditions requiring further emergency intervention are present at this time. The patient/guardian have been advised of the diagnosis and plan. We have discussed signs and symptoms that warrant return to the ED, such as changes or worsening in symptoms.  Vital signs are stable at discharge. Filed Vitals:   02/22/13 1746  BP: 145/64  Pulse: 80  Temp: 98.4 F (36.9 C)  Resp: 18    Patient/guardian has voiced understanding and agreed to follow-up with the PCP or specialist.  I personally performed the services described in this documentation, which was scribed in my presence. The recorded information has been reviewed and is accurate.      Dorthula Matas, PA-C 02/23/13 [phone]

## 2013-02-22 NOTE — ED Notes (Addendum)
Per pt has experienced headache and vomiting x2 days. Pt reports taking cough drops for throat pain related to "throwing up." Pt denies n/v at present time.

## 2013-02-23 ENCOUNTER — Telehealth (HOSPITAL_COMMUNITY): Payer: Self-pay

## 2013-02-23 NOTE — ED Provider Notes (Signed)
  Medical screening examination/treatment/procedure(s) were performed by non-physician practitioner and as supervising physician I was immediately available for consultation/collaboration.  EKG Interpretation   None          Gerhard Munch, MD 02/23/13 2328

## 2013-02-23 NOTE — ED Notes (Signed)
Pharmacy call pt at pharmacy has Rx for Zofran but was supposed to get one for abx but doesn't have it.  Chart reviewed and EPIC showing Rx for pt for Cipro as well.  Info provided to pharmacist.

## 2013-07-12 IMAGING — CT CT ABD-PELV W/ CM
2 of 4 series · 14 of 32 positions shown, 19 images · IV contrast (water/omni  & 100ml omni 300)
Comparison: None

CLINICAL DATA: Lower abdominal pain, diarrhea, symptoms for 2
weeks, history asthma

CT ABDOMEN AND PELVIS WITH CONTRAST
TECHNIQUE: Multidetector CT imaging of the abdomen and pelvis was
performed following the standard protocol during bolus
administration of intravenous contrast. Sagittal and coronal MPR
images reconstructed from axial data set.
Contrast: 100mL OMNIPAQUE IOHEXOL 300 MG/ML  SOLN Dilute oral
contrast.

[Series 2: routine abdomen · axial · 0.98mm/px · z∈[-496,-106]mm · 7 of 106 slices shown, 12 images]
[im 14/106  soft-tissue]
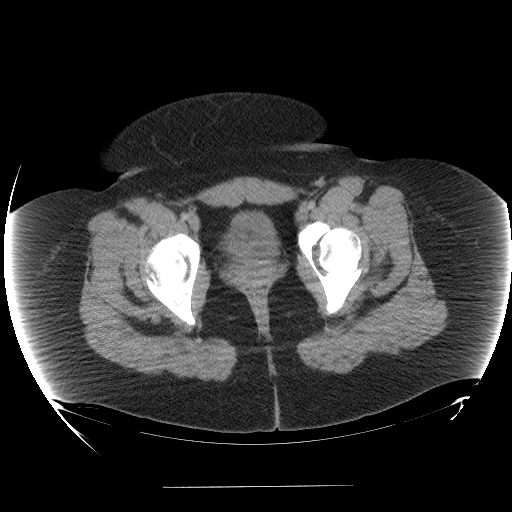
[im 14/106  bone]
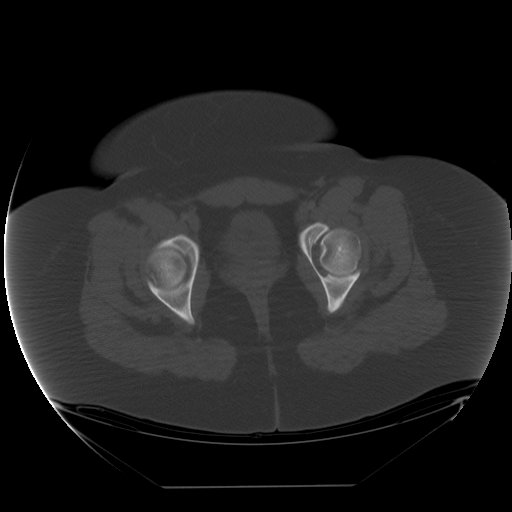
[im 27/106  soft-tissue]
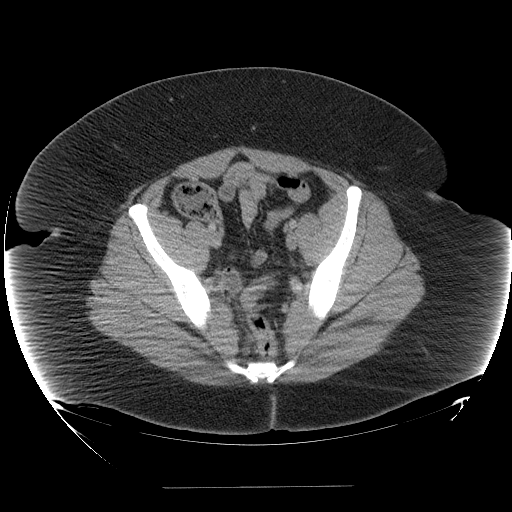
[im 40/106  soft-tissue]
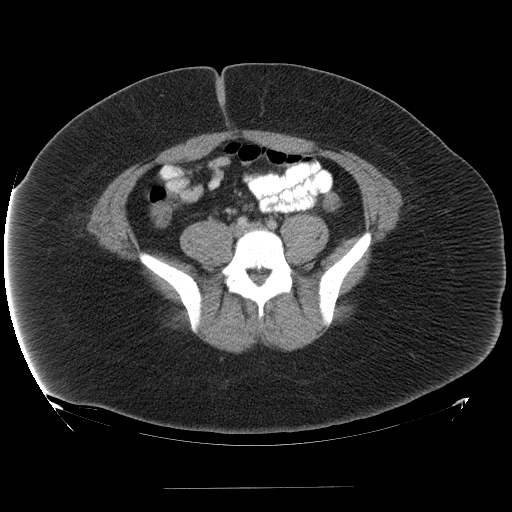
[im 53/106  soft-tissue]
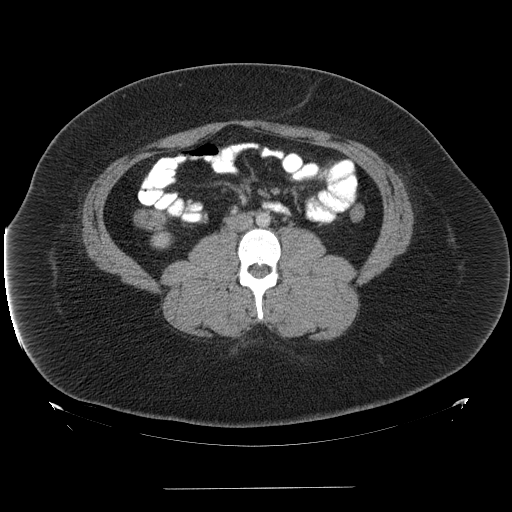
[im 53/106  lung]
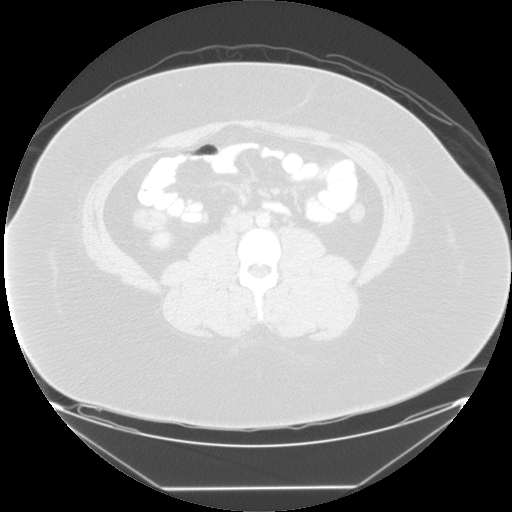
[im 66/106  soft-tissue]
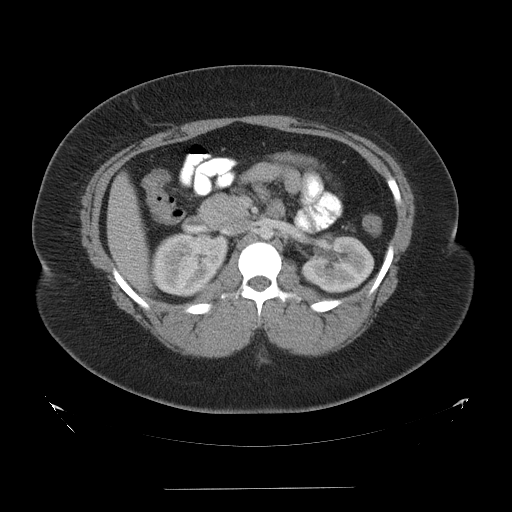
[im 66/106  lung]
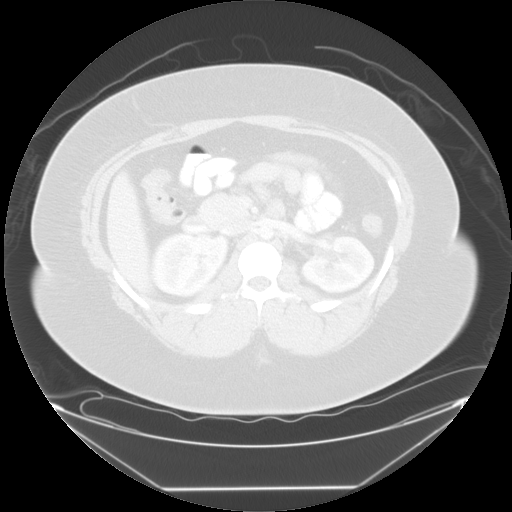
[im 79/106  soft-tissue]
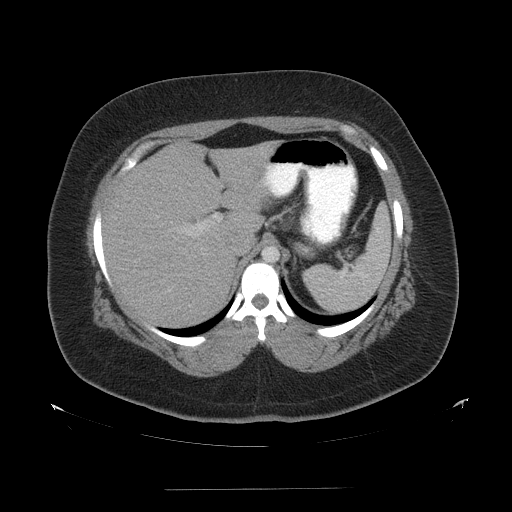
[im 79/106  lung]
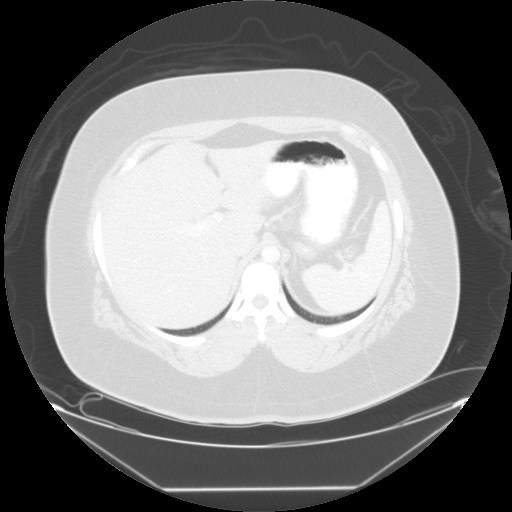
[im 92/106  soft-tissue]
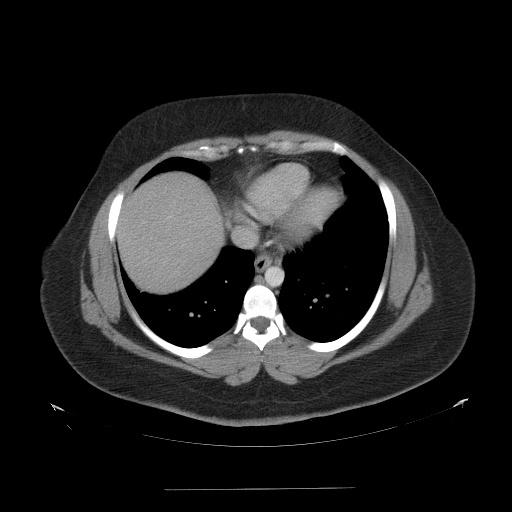
[im 92/106  lung]
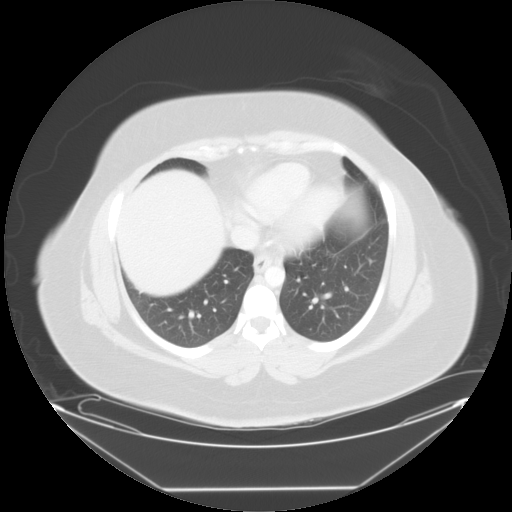

[Series 401: sag · sagittal · 1.04mm/px · 7 of 139 slices shown]
[im 14/139  soft-tissue]
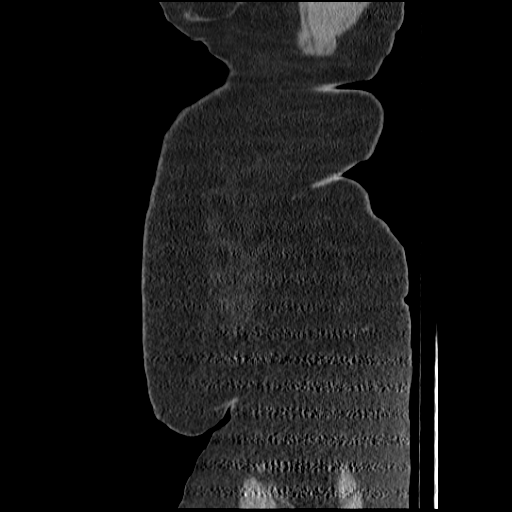
[im 28/139  soft-tissue]
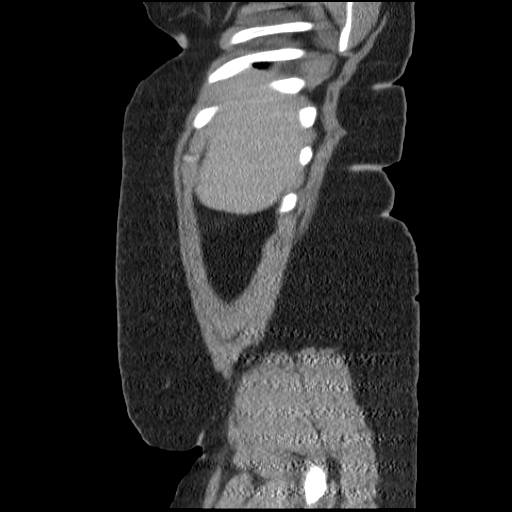
[im 42/139  soft-tissue]
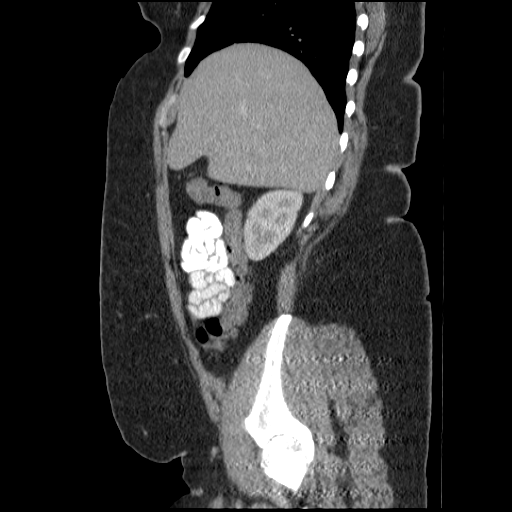
[im 56/139  soft-tissue]
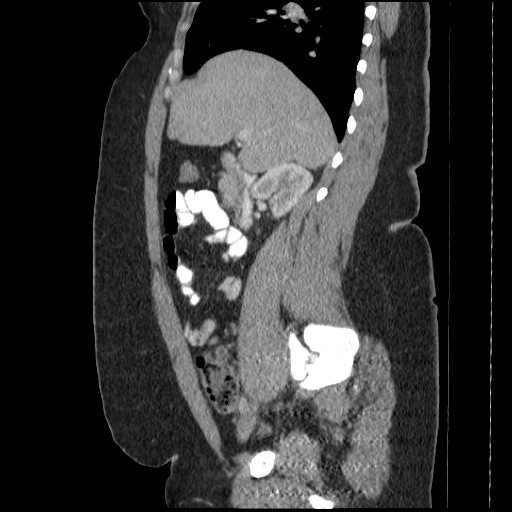
[im 83/139  soft-tissue]
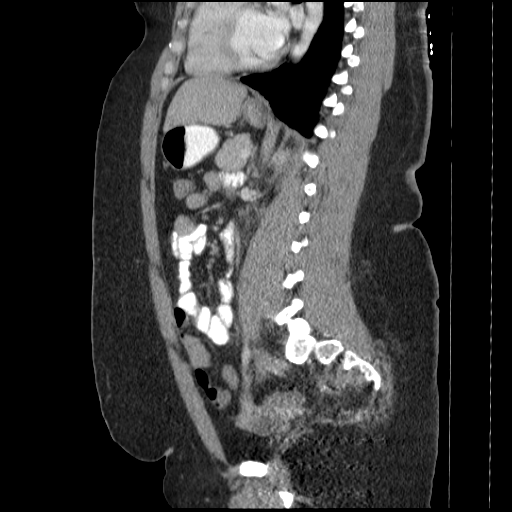
[im 97/139  soft-tissue]
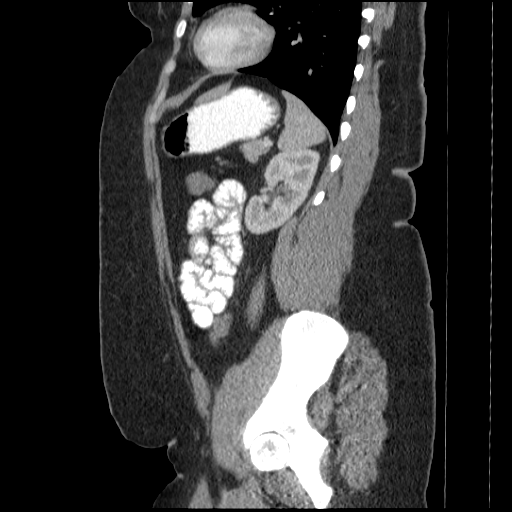
[im 111/139  soft-tissue]
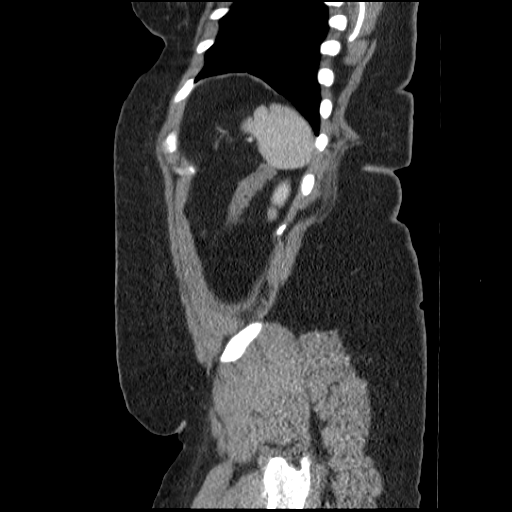

[14 of 32 positions shown; findings below may reference images not displayed]

FINDINGS: Lung bases clear.
Gallbladder surgically absent.
Liver, spleen, pancreas, kidneys, and adrenal glands normal.
Appendix not visualized but no pericecal inflammatory process seen.
Small left ovarian cyst 2.6 x 1.9 cm image 88.
Unremarkable bladder, ureters, uterus and right adnexa.
Small amount of low attenuation free pelvic fluid.
Stomach and bowel loops grossly unremarkable.
No mass, adenopathy, free air or hernia.
No acute osseous findings.
IMPRESSION: Small amount of nonspecific low attenuation free pelvic fluid.
Small left ovarian cyst.
Otherwise negative exam.

## 2013-12-01 ENCOUNTER — Encounter (HOSPITAL_COMMUNITY): Payer: Self-pay | Admitting: Emergency Medicine

## 2013-12-01 ENCOUNTER — Emergency Department (HOSPITAL_COMMUNITY)
Admission: EM | Admit: 2013-12-01 | Discharge: 2013-12-01 | Disposition: A | Discharge status: Home / Self Care | Payer: No Typology Code available for payment source | Attending: Emergency Medicine | Admitting: Emergency Medicine

## 2013-12-01 DIAGNOSIS — Z87891 Personal history of nicotine dependence: Secondary | ICD-10-CM | POA: Insufficient documentation

## 2013-12-01 DIAGNOSIS — N390 Urinary tract infection, site not specified: Secondary | ICD-10-CM | POA: Diagnosis not present

## 2013-12-01 DIAGNOSIS — Z792 Long term (current) use of antibiotics: Secondary | ICD-10-CM | POA: Insufficient documentation

## 2013-12-01 DIAGNOSIS — E669 Obesity, unspecified: Secondary | ICD-10-CM | POA: Insufficient documentation

## 2013-12-01 DIAGNOSIS — J45901 Unspecified asthma with (acute) exacerbation: Secondary | ICD-10-CM | POA: Diagnosis not present

## 2013-12-01 DIAGNOSIS — R062 Wheezing: Secondary | ICD-10-CM | POA: Insufficient documentation

## 2013-12-01 LAB — URINALYSIS, ROUTINE W REFLEX MICROSCOPIC
Bilirubin Urine: NEGATIVE
Glucose, UA: NEGATIVE mg/dL
Hgb urine dipstick: NEGATIVE
KETONES UR: NEGATIVE mg/dL
Nitrite: NEGATIVE
Protein, ur: NEGATIVE mg/dL
SPECIFIC GRAVITY, URINE: 1.029 (ref 1.005–1.030)
Urobilinogen, UA: 1 mg/dL (ref 0.0–1.0)
pH: 7.5 (ref 5.0–8.0)

## 2013-12-01 LAB — URINE MICROSCOPIC-ADD ON

## 2013-12-01 MED ORDER — CEPHALEXIN 500 MG PO CAPS
500.0000 mg | ORAL_CAPSULE | Freq: Once | ORAL | Status: AC
  Administered 2013-12-01: 500 mg via ORAL
  Filled 2013-12-01: qty 1

## 2013-12-01 MED ORDER — CEPHALEXIN 500 MG PO CAPS: 500.0000 mg | ORAL_CAPSULE | Freq: Four times a day (QID) | ORAL | Status: DC

## 2013-12-01 MED ORDER — ALBUTEROL SULFATE HFA 108 (90 BASE) MCG/ACT IN AERS
2.0000 | INHALATION_SPRAY | Freq: Once | RESPIRATORY_TRACT | Status: AC
  Administered 2013-12-01: 2 via RESPIRATORY_TRACT
  Filled 2013-12-01: qty 6.7

## 2013-12-01 NOTE — Discharge Instructions (Signed)
Use albuterol as needed for wheezing. Take Keflex as directed until gone. Refer to attached documents for more information.

## 2013-12-01 NOTE — ED Provider Notes (Signed)
CSN: [id number]     Arrival date & time 12/01/13  1948 History  This chart was scribed for non-physician practitioner working with Hurman Horn, MD by Elveria Rising, ED Scribe. This patient was seen in room WTR6/WTR6 and the patient's care was started at 9:53 PM.   Chief Complaint  Patient presents with  . Wheezing  . Hematuria     The history is provided by the patient. No language interpreter was used.   HPI Comments: Diane Wong is a 28 y.o. female with history of asthma who presents to the Emergency Department complaining of cold symptoms with associated wheezing for two days. Patient reports expired inhaler at home. Hematuria  Patient reports burning with urination, hematuria, and lower back and abdominal pain for one week.     Past Medical History  Diagnosis Date  . Asthma   . Obesity    Past Surgical History  Procedure Laterality Date  . Cholecystectomy     Family History  Problem Relation Age of Onset  . Hypertension Mother    History  Substance Use Topics  . Smoking status: Former Smoker    Types: Cigars  . Smokeless tobacco: Not on file  . Alcohol Use: Yes     Comment: Occasional    OB History   Grav Para Term Preterm Abortions TAB SAB Ect Mult Living                 Review of Systems  Respiratory: Positive for cough and wheezing.   Gastrointestinal: Positive for abdominal pain.  Genitourinary: Positive for dysuria, urgency and hematuria.  Musculoskeletal: Positive for back pain.    Allergies  Review of patient's allergies indicates no known allergies.  Home Medications   Prior to Admission medications   Medication Sig Start Date End Date Taking? Authorizing Provider  ciprofloxacin (CIPRO) 500 MG tablet Take 1 tablet (500 mg total) by mouth 2 (two) times daily. 02/22/13   Tiffany Irine Seal, PA-C  ondansetron (ZOFRAN) 4 MG tablet Take 1 tablet (4 mg total) by mouth every 6 (six) hours. 02/22/13   Dorthula Matas, PA-C   Triage Vitals: BP 137/98   Pulse 80  Temp(Src) 98.3 F (36.8 C) (Oral)  Resp 18  SpO2 100%  LMP 11/25/2013  Physical Exam  Nursing note and vitals reviewed. Constitutional: She is oriented to person, place, and time. She appears well-developed and well-nourished. No distress.  HENT:  Head: Normocephalic and atraumatic.  Eyes:  Normal appearance  Neck: Normal range of motion.  Pulmonary/Chest: Effort normal and breath sounds normal. No respiratory distress. She has no rales. She exhibits no tenderness.  Mild expiratory wheezing in right upper lobe.   Abdominal: Soft. There is tenderness.  Suprapubic tenderness to palpation. No other focal tenderness.   Musculoskeletal: Normal range of motion.  Neurological: She is alert and oriented to person, place, and time.  Psychiatric: She has a normal mood and affect. Her behavior is normal.    ED Course  Procedures (including critical care time)  COORDINATION OF CARE: 9:59 PM- Discussed treatment plan with patient at bedside and patient agreed to plan.   Labs Review Labs Reviewed  URINALYSIS, ROUTINE W REFLEX MICROSCOPIC - Abnormal; Notable for the following:    APPearance CLOUDY (*)    Leukocytes, UA SMALL (*)    All other components within normal limits  URINE MICROSCOPIC-ADD ON - Abnormal; Notable for the following:    Squamous Epithelial / LPF FEW (*)    Bacteria, UA  FEW (*)    All other components within normal limits    Imaging Review No results found.   EKG Interpretation None      MDM     Final diagnoses:  UTI (lower urinary tract infection)  Wheezing    Patient will be treated for UTI with keflex. Patient has mild wheezing in RUL. Vitals stable and patient afebrile.   I personally performed the services described in this documentation, which was scribed in my presence. The recorded information has been reviewed and is accurate.    Emilia Beck, PA-C 12/01/13 2325

## 2013-12-01 NOTE — ED Notes (Signed)
Per pt she has hx of asthma and began wheezing 2 days ago.  Pt has used her inhalers w/o relief.  Pt also c/o hematuria and dysuria.  Pt is speaking in full sentences breathing is unlabored.

## 2013-12-11 ENCOUNTER — Emergency Department (HOSPITAL_COMMUNITY)
Admission: EM | Admit: 2013-12-11 | Discharge: 2013-12-11 | Disposition: A | Discharge status: Home / Self Care | Payer: No Typology Code available for payment source | Source: Home / Self Care | Attending: Family Medicine | Admitting: Family Medicine

## 2013-12-11 ENCOUNTER — Encounter (HOSPITAL_COMMUNITY): Payer: Self-pay | Admitting: Emergency Medicine

## 2013-12-11 DIAGNOSIS — J0111 Acute recurrent frontal sinusitis: Secondary | ICD-10-CM

## 2013-12-11 DIAGNOSIS — J0110 Acute frontal sinusitis, unspecified: Secondary | ICD-10-CM

## 2013-12-11 MED ORDER — GUAIFENESIN-CODEINE 100-10 MG/5ML PO SYRP: 10.0000 mL | ORAL_SOLUTION | Freq: Four times a day (QID) | ORAL | Status: DC | PRN

## 2013-12-11 MED ORDER — IPRATROPIUM BROMIDE 0.06 % NA SOLN
2.0000 | Freq: Four times a day (QID) | NASAL | Status: DC
Start: 2013-12-11 — End: 2014-07-30

## 2013-12-11 MED ORDER — MINOCYCLINE HCL 100 MG PO CAPS: 100.0000 mg | ORAL_CAPSULE | Freq: Two times a day (BID) | ORAL | Status: DC

## 2013-12-11 NOTE — ED Provider Notes (Signed)
CSN: [id number]     Arrival date & time 12/11/13  0854 History   First MD Initiated Contact with Patient 12/11/13 [phone]     Chief Complaint  Patient presents with  . URI   (Consider location/radiation/quality/duration/timing/severity/associated sxs/prior Treatment) Patient is a 28 y.o. female presenting with URI. The history is provided by the patient.  URI Presenting symptoms: congestion, cough and rhinorrhea   Presenting symptoms: no fever   Severity:  Moderate Onset quality:  Gradual Duration:  2 weeks Progression:  Unchanged Chronicity:  New Worsened by:  Nothing tried Associated symptoms: no wheezing     Past Medical History  Diagnosis Date  . Asthma   . Obesity    Past Surgical History  Procedure Laterality Date  . Cholecystectomy     Family History  Problem Relation Age of Onset  . Hypertension Mother    History  Substance Use Topics  . Smoking status: Former Smoker    Types: Cigars  . Smokeless tobacco: Not on file  . Alcohol Use: Yes     Comment: Occasional    OB History   Grav Para Term Preterm Abortions TAB SAB Ect Mult Living                 Review of Systems  Constitutional: Negative.  Negative for fever.  HENT: Positive for congestion, postnasal drip and rhinorrhea.   Respiratory: Positive for cough. Negative for wheezing.   Cardiovascular: Negative.   Gastrointestinal: Negative.     Allergies  Review of patient's allergies indicates no known allergies.  Home Medications   Prior to Admission medications   Medication Sig Start Date End Date Taking? Authorizing Provider  cephALEXin (KEFLEX) 500 MG capsule Take 1 capsule (500 mg total) by mouth 4 (four) times daily. 12/01/13   Emilia Beck, PA-C  ciprofloxacin (CIPRO) 500 MG tablet Take 1 tablet (500 mg total) by mouth 2 (two) times daily. 02/22/13   Tiffany Irine Seal, PA-C  guaiFENesin-codeine (ROBITUSSIN AC) 100-10 MG/5ML syrup Take 10 mLs by mouth 4 (four) times daily as needed for cough.  12/11/13   Linna Hoff, MD  ipratropium (ATROVENT) 0.06 % nasal spray Place 2 sprays into both nostrils 4 (four) times daily. 12/11/13   Linna Hoff, MD  minocycline (MINOCIN,DYNACIN) 100 MG capsule Take 1 capsule (100 mg total) by mouth 2 (two) times daily. 12/11/13   Linna Hoff, MD  ondansetron (ZOFRAN) 4 MG tablet Take 1 tablet (4 mg total) by mouth every 6 (six) hours. 02/22/13   Tiffany Irine Seal, PA-C   BP 145/70  Pulse 97  Temp(Src) 98.4 F (36.9 C) (Oral)  Resp 20  LMP 11/27/2013 Physical Exam  Nursing note and vitals reviewed. Constitutional: She is oriented to person, place, and time. She appears well-developed and well-nourished.  HENT:  Right Ear: External ear normal.  Left Ear: External ear normal.  Nose: Mucosal edema and rhinorrhea present.  Mouth/Throat: Oropharynx is clear and moist.  Eyes: Pupils are equal, round, and reactive to light.  Neck: Normal range of motion. Neck supple.  Pulmonary/Chest: Effort normal and breath sounds normal. She has no wheezes.  Lymphadenopathy:    She has no cervical adenopathy.  Neurological: She is alert and oriented to person, place, and time.  Skin: Skin is warm and dry.    ED Course  Procedures (including critical care time) Labs Review Labs Reviewed - No data to display  Imaging Review No results found.   MDM   1. Acute  recurrent frontal sinusitis        Linna Hoff, MD 12/11/13 [phone]

## 2013-12-11 NOTE — ED Provider Notes (Signed)
Medical screening examination/treatment/procedure(s) were performed by non-physician practitioner and as supervising physician I was immediately available for consultation/collaboration.   EKG Interpretation None       Hurman Horn, MD 12/11/13 [phone]

## 2013-12-11 NOTE — ED Notes (Signed)
Pt  Reports  Symptoms  Of  Cough   /  Congested    sorethroat    As  Well  As   Some  Chills           And  A  Headache     Pt  Reports  Symptoms  X  sev  Weeks

## 2014-04-16 ENCOUNTER — Encounter (HOSPITAL_COMMUNITY): Payer: Self-pay | Admitting: Emergency Medicine

## 2014-04-16 ENCOUNTER — Emergency Department (HOSPITAL_COMMUNITY)
Admission: EM | Admit: 2014-04-16 | Discharge: 2014-04-16 | Disposition: A | Discharge status: Home / Self Care | Payer: No Typology Code available for payment source | Source: Home / Self Care | Attending: Family Medicine | Admitting: Family Medicine

## 2014-04-16 DIAGNOSIS — G43919 Migraine, unspecified, intractable, without status migrainosus: Secondary | ICD-10-CM

## 2014-04-16 DIAGNOSIS — R112 Nausea with vomiting, unspecified: Secondary | ICD-10-CM

## 2014-04-16 MED ORDER — DEXAMETHASONE SODIUM PHOSPHATE 10 MG/ML IJ SOLN
10.0000 mg | Freq: Once | INTRAMUSCULAR | Status: AC
  Administered 2014-04-16: 10 mg via INTRAMUSCULAR

## 2014-04-16 MED ORDER — CIPROFLOXACIN HCL 500 MG PO TABS: 500.0000 mg | ORAL_TABLET | Freq: Two times a day (BID) | ORAL | Status: DC

## 2014-04-16 MED ORDER — SUMATRIPTAN SUCCINATE 6 MG/0.5ML ~~LOC~~ SOLN
6.0000 mg | Freq: Once | SUBCUTANEOUS | Status: AC
  Administered 2014-04-16: 6 mg via SUBCUTANEOUS

## 2014-04-16 MED ORDER — ONDANSETRON HCL 4 MG PO TABS: 4.0000 mg | ORAL_TABLET | Freq: Four times a day (QID) | ORAL | Status: DC

## 2014-04-16 MED ORDER — DEXAMETHASONE SODIUM PHOSPHATE 10 MG/ML IJ SOLN
INTRAMUSCULAR | Status: AC
  Filled 2014-04-16: qty 1

## 2014-04-16 MED ORDER — SUMATRIPTAN SUCCINATE 6 MG/0.5ML ~~LOC~~ SOLN
SUBCUTANEOUS | Status: AC
  Filled 2014-04-16: qty 0.5

## 2014-04-16 NOTE — ED Provider Notes (Addendum)
CSN: [id number]     Arrival date & time 04/16/14  1236 History   First MD Initiated Contact with Patient 04/16/14 1311     Chief Complaint  Patient presents with  . Emesis  . Diarrhea   (Consider location/radiation/quality/duration/timing/severity/associated sxs/prior Treatment) HPI  Vomiting and diarrhea. Started yesterday. [employer]te at [employer]. 15 min later developed symptoms. 9 episodes of non-bloody non-bilious emesis. Watery diarrhea (x4). crampy pain. Come sand goes. pepto-bismal w/o benefit. Imodium w/ some benefit. Denies fevers.   HA for last couple of weeks. Chronic condition for pt. Current episode present nearly every morning since thanksgiving. Tried excedrin and tylenol w/ some benefit. Very infrequent Caffeine use. Frontal. Feels like hammer being hit into head. Relief w/ sleep. Light and noise worsens HA. Similar symptoms to previous just lasting longer than normal.    LMP 11/30. Cycles are irregular at baseline. No sexually active.   Past Medical History  Diagnosis Date  . Asthma   . Obesity    Past Surgical History  Procedure Laterality Date  . Cholecystectomy     Family History  Problem Relation Age of Onset  . Hypertension Mother    History  Substance Use Topics  . Smoking status: Former Smoker    Types: Cigars  . Smokeless tobacco: Not on file  . Alcohol Use: Yes     Comment: Occasional    OB History    No data available     Review of Systems Per HPI with all other pertinent systems negative.   Allergies  Review of patient's allergies indicates no known allergies.  Home Medications   Prior to Admission medications   Medication Sig Start Date End Date Taking? Authorizing Provider  cephALEXin (KEFLEX) 500 MG capsule Take 1 capsule (500 mg total) by mouth 4 (four) times daily. 12/01/13   Emilia BeckKaitlyn Szekalski, PA-C  ciprofloxacin (CIPRO) 500 MG tablet Take 1 tablet (500 mg total) by mouth 2 (two) times daily. 04/16/14   Ozella Rocksavid J Shante Archambeault, MD   ciprofloxacin (CIPRO) 500 MG tablet Take 1 tablet (500 mg total) by mouth 2 (two) times daily. 04/16/14   Ozella Rocksavid J Taurus Willis, MD  guaiFENesin-codeine [facility](ROBITUSSIN AC) 100-10 MG/5ML syrup Take 10 mLs by mouth 4 (four) times daily as needed for cough. 12/11/13   Linna HoffJames D Kindl, MD  ipratropium (ATROVENT) 0.06 % nasal spray Place 2 sprays into both nostrils 4 (four) times daily. 12/11/13   Linna HoffJames D Kindl, MD  minocycline (MINOCIN,DYNACIN) 100 MG capsule Take 1 capsule (100 mg total) by mouth 2 (two) times daily. 12/11/13   Linna HoffJames D Kindl, MD  ondansetron (ZOFRAN) 4 MG tablet Take 1 tablet (4 mg total) by mouth every 6 (six) hours. 04/16/14   Ozella Rocksavid J Raechal Raben, MD   BP 129/77 mmHg  Pulse 77  Temp(Src) 98 F (36.7 C) (Oral)  Resp 16  SpO2 100%  LMP 03/11/2014 (Exact Date) Physical Exam  Constitutional: She is oriented to person, place, and time. She appears well-developed and well-nourished. No distress.  HENT:  Head: Normocephalic and atraumatic.  Eyes: EOM are normal. Pupils are equal, round, and reactive to light.  Neck: Normal range of motion.  Cardiovascular: Normal heart sounds.   Pulmonary/Chest: Effort normal and breath sounds normal.  Abdominal: Soft. Bowel sounds are normal. She exhibits no distension.  Musculoskeletal: Normal range of motion.  Neurological: She is alert and oriented to person, place, and time.  Skin: Skin is warm and dry. She is not diaphoretic.  Psychiatric: She has a normal  mood and affect. Her behavior is normal. Judgment and thought content normal.    ED Course  Procedures (including critical care time) Labs Review Labs Reviewed - No data to display  Imaging Review No results found.   MDM   1. Nausea and vomiting, vomiting of unspecified type   2. Intractable migraine, unspecified migraine type    Viral gastro vs salmonella or Ecoli poisoning.   Imitrex  [place] in office  Decadron  IM in office  Start zofran   Rest and fluids  Start Cipro if not  improving or worsens  Precautions given and all questions answered  Shelly Flatten, MD Family Medicine 04/16/2014, 1:36 PM      Ozella Rocks, MD 04/16/14 1336  Ozella Rocks, MD 04/16/14 [phone]

## 2014-04-16 NOTE — ED Notes (Signed)
Reports having an acute on set of vomiting and diarrhea after eating [employer].  Chills.  Denies fever.  On set yesterday.

## 2014-04-16 NOTE — Discharge Instructions (Signed)
You likely have a gut infection that is either viral or bacterial (related to [employer] Company[employer]coli ro Salmonella) This may go away by itself in 1-2 days Consider starting the antibiotics Use the zofran for nausea Start using a daily probiotic Please get lots of rest for headache relief. You were given medicine to help with this Please follow up in the emergency room if you do not improve

## 2014-07-30 ENCOUNTER — Encounter (HOSPITAL_COMMUNITY): Payer: Self-pay | Admitting: *Deleted

## 2014-07-30 ENCOUNTER — Emergency Department (HOSPITAL_COMMUNITY)
Admission: EM | Admit: 2014-07-30 | Discharge: 2014-07-30 | Disposition: A | Discharge status: Home / Self Care | Payer: No Typology Code available for payment source | Source: Home / Self Care | Attending: Emergency Medicine | Admitting: Emergency Medicine

## 2014-07-30 DIAGNOSIS — M545 Low back pain, unspecified: Secondary | ICD-10-CM

## 2014-07-30 MED ORDER — MELOXICAM 15 MG PO TABS: 15.0000 mg | ORAL_TABLET | Freq: Every day | ORAL | Status: AC

## 2014-07-30 MED ORDER — TRAMADOL HCL 50 MG PO TABS: 50.0000 mg | ORAL_TABLET | Freq: Four times a day (QID) | ORAL | Status: AC | PRN

## 2014-07-30 MED ORDER — PREDNISONE 50 MG PO TABS: ORAL_TABLET | ORAL | Status: AC

## 2014-07-30 NOTE — ED Provider Notes (Signed)
CSN: [id number]     Arrival date & time 07/30/14  1621 History   First MD Initiated Contact with Patient 07/30/14 1803     Chief Complaint  Patient presents with  . Back Pain   (Consider location/radiation/quality/duration/timing/severity/associated sxs/prior Treatment) HPI  She is a 29 year old woman here for evaluation of back pain. It is in her left lower back. It started about 2 weeks ago and has gradually been getting worse. The pain will wrap around to her left groin. She will also occasionally gets pains that shoot up her left back and down to her left knee with certain movements. Pain is worse with the act of sitting. She has tried icy hot without improvement. Ibuprofen has not helped. No bowel or bladder incontinence. No numbness, tingling, weakness in her lower extremities. She denies any injury or trauma. 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  Past Medical History  Diagnosis Date  . Asthma   . Obesity    Past Surgical History  Procedure Laterality Date  . Cholecystectomy  2009   Family History  Problem Relation Age of Onset  . Hypertension Mother    History  Substance Use Topics  . Smoking status: Former Smoker    Types: Cigars    Quit date: 08/24/2013  . Smokeless tobacco: Not on file  . Alcohol Use: Yes     Comment: Occasional    OB History    No data available     Review of Systems As in history of present illness  Allergies  Review of patient's allergies indicates no known allergies.  Home Medications   Prior to Admission medications   Medication Sig Start Date End Date Taking? Authorizing Provider  albuterol (PROVENTIL HFA;VENTOLIN HFA) 108 (90 BASE) MCG/ACT inhaler Inhale 2 puffs into the lungs every 6 (six) hours as needed for wheezing or shortness of breath.   Yes Historical Provider, MD  meloxicam (MOBIC) 15 MG tablet Take 1 tablet (15 mg total) by mouth daily. 07/30/14   Charm RingsErin J Xue Low, MD  predniSONE (DELTASONE) 50 MG tablet  Take 1 pill daily for 5 days. 07/30/14   Charm RingsErin J Khiree Bukhari, MD  traMADol (ULTRAM) 50 MG tablet Take 1 tablet (50 mg total) by mouth every 6 (six) hours as needed. 07/30/14   Charm RingsErin J Javarious Elsayed, MD   BP 139/90 mmHg  Temp(Src) 98.1 F (36.7 C) (Oral)  Resp 20  SpO2 100%  LMP 07/26/2014 Physical Exam  Constitutional: She is oriented to person, place, and time. She appears well-developed and well-nourished. She appears distressed (uncomfortable when getting on and off the exam table).  Neck: Neck supple.  Cardiovascular: Normal rate.   Pulmonary/Chest: Effort normal.  Musculoskeletal:  Back: No erythema or edema. No vertebral tenderness or step-offs. She is tender over the left SI area. No piriformis or greater trochanter tenderness. She has some pain in her lower back with internal rotation of the hip. Pain in lower back with seated straight leg raise on the left. 5 out of 5 strength in bilateral lower extremities.  Neurological: She is alert and oriented to person, place, and time.    ED Course  Procedures (including critical care time) Labs Review Labs Reviewed - No data to display  Imaging Review No results found.   MDM   1. Left-sided low back pain without sciatica    Will treat with prednisone and meloxicam. Recommended application of ice. Prescription for tramadol provided. Follow-up if no improvement in 1-2 weeks.  Charm Rings, MD 07/30/14 Rickey Primus

## 2014-07-30 NOTE — Discharge Instructions (Signed)
The likely have inflammation of your SI joint. Take prednisone daily for 5 days. Take meloxicam daily for 1 week, then as needed. Use tramadol every 6-8 hours as needed for pain. This medication may make you sleepy. You should see improvement in the next week. Follow-up as needed.

## 2014-07-30 NOTE — ED Notes (Signed)
C/o pain in low back that radiates to L groin and up her back in the middle and down her L leg to her knee x 2 weeks. No known injury.  Tried icy hot and Motrin. Icy Hot worked for 2 days then stopped working.  Had a previous injury in HS while playing basketball and was in her R hip.

## 2014-08-06 ENCOUNTER — Encounter (HOSPITAL_COMMUNITY): Payer: Self-pay | Admitting: Emergency Medicine

## 2014-08-06 ENCOUNTER — Emergency Department (HOSPITAL_COMMUNITY)
Admission: EM | Admit: 2014-08-06 | Discharge: 2014-08-06 | Disposition: A | Discharge status: Home / Self Care | Payer: No Typology Code available for payment source | Source: Home / Self Care | Attending: Family Medicine | Admitting: Family Medicine

## 2014-08-06 DIAGNOSIS — M5442 Lumbago with sciatica, left side: Secondary | ICD-10-CM | POA: Diagnosis not present

## 2014-08-06 LAB — POCT URINALYSIS DIP (DEVICE)
BILIRUBIN URINE: NEGATIVE
Glucose, UA: NEGATIVE mg/dL
HGB URINE DIPSTICK: NEGATIVE
Ketones, ur: NEGATIVE mg/dL
Nitrite: NEGATIVE
PROTEIN: NEGATIVE mg/dL
SPECIFIC GRAVITY, URINE: 1.025 (ref 1.005–1.030)
UROBILINOGEN UA: 0.2 mg/dL (ref 0.0–1.0)
pH: 7 (ref 5.0–8.0)

## 2014-08-06 LAB — POCT PREGNANCY, URINE: Preg Test, Ur: NEGATIVE

## 2014-08-06 NOTE — Discharge Instructions (Signed)
Back Exercises °Back exercises help treat and prevent back injuries. The goal of back exercises is to increase the strength of your abdominal and back muscles and the flexibility of your back. These exercises should be started when you no longer have back pain. Back exercises include: °· Pelvic Tilt. Lie on your back with your knees bent. Tilt your pelvis until the lower part of your back is against the floor. Hold this position 5 to 10 sec and repeat 5 to 10 times. °· Knee to Chest. Pull first 1 knee up against your chest and hold for 20 to 30 seconds, repeat this with the other knee, and then both knees. This may be done with the other leg straight or bent, whichever feels better. °· Sit-Ups or Curl-Ups. Bend your knees 90 degrees. Start with tilting your pelvis, and do a partial, slow sit-up, lifting your trunk only 30 to 45 degrees off the floor. Take at least 2 to 3 seconds for each sit-up. Do not do sit-ups with your knees out straight. If partial sit-ups are difficult, simply do the above but with only tightening your abdominal muscles and holding it as directed. °· Hip-Lift. Lie on your back with your knees flexed 90 degrees. Push down with your feet and shoulders as you raise your hips a couple inches off the floor; hold for 10 seconds, repeat 5 to 10 times. °· Back arches. Lie on your stomach, propping yourself up on bent elbows. Slowly press on your hands, causing an arch in your low back. Repeat 3 to 5 times. Any initial stiffness and discomfort should lessen with repetition over time. °· Shoulder-Lifts. Lie face down with arms beside your body. Keep hips and torso pressed to floor as you slowly lift your head and shoulders off the floor. °Do not overdo your exercises, especially in the beginning. Exercises may cause you some mild back discomfort which lasts for a few minutes; however, if the pain is more severe, or lasts for more than 15 minutes, do not continue exercises until you see your caregiver.  Improvement with exercise therapy for back problems is slow.  °See your caregivers for assistance with developing a proper back exercise program. °Document Released: 05/06/2004 Document Revised: 06/21/2011 Document Reviewed: 01/28/2011 °ExitCare® Patient Information ©2015 [employer]. This information is not intended to replace advice given to you by your health care provider. Make sure you discuss any questions you have with your health care provider. ° °Back Pain, Adult °Back pain is very common. The pain often gets better over time. The cause of back pain is usually not dangerous. Most people can learn to manage their back pain on their own.  °HOME CARE  °· Stay active. Start with short walks on flat ground if you can. Try to walk farther each day. °· Do not sit, drive, or stand in one place for more than 30 minutes. Do not stay in bed. °· Do not avoid exercise or work. Activity can help your back heal faster. °· Be careful when you bend or lift an object. Bend at your knees, keep the object close to you, and do not twist. °· Sleep on a firm mattress. Lie on your side, and bend your knees. If you lie on your back, put a pillow under your knees. °· Only take medicines as told by your doctor. °· Put ice on the injured area. °¨ Put ice in a plastic bag. °¨ Place a towel between your skin and the bag. °¨ Leave the   ice on for 15-20 minutes, 03-04 times a day for the first 2 to 3 days. After that, you can switch between ice and heat packs. °· Ask your doctor about back exercises or massage. °· Avoid feeling anxious or stressed. Find good ways to deal with stress, such as exercise. °GET HELP RIGHT AWAY IF:  °· Your pain does not go away with rest or medicine. °· Your pain does not go away in 1 week. °· You have new problems. °· You do not feel well. °· The pain spreads into your legs. °· You cannot control when you poop (bowel movement) or pee (urinate). °· Your arms or legs feel weak or lose feeling  (numbness). °· You feel sick to your stomach (nauseous) or throw up (vomit). °· You have belly (abdominal) pain. °· You feel like you may pass out (faint). °MAKE SURE YOU:  °· Understand these instructions. °· Will watch your condition. °· Will get help right away if you are not doing well or get worse. °Document Released: 09/15/2007 Document Revised: 06/21/2011 Document Reviewed: 07/31/2013 °ExitCare® Patient Information ©2015 [employer]. This information is not intended to replace advice given to you by your health care provider. Make sure you discuss any questions you have with your health care provider. ° °

## 2014-08-06 NOTE — ED Notes (Signed)
C/o lower back pain States she was seen here last week by Dr. Piedad ClimesHonig States she was told her SI joint was swollen States pain was only on the left side of back but now the pain is on both sides of back Ice pak and meds was used as tx

## 2014-08-06 NOTE — ED Provider Notes (Signed)
CSN: [id number]     Arrival date & time 08/06/14  1219 History   First MD Initiated Contact with Patient 08/06/14 1439     Chief Complaint  Patient presents with  . Back Pain   (Consider location/radiation/quality/duration/timing/severity/associated sxs/prior Treatment) HPI Comments: She is a 29 year old woman here for re-evaluation of back pain. Was initially seen and treated on 07/30/2014 and prescribed prednisone taper and daily mobic. Her symptoms have improved, but have not resolved completely. Is requesting a note so that she can return to full duties at work.  It is in her left lower back. It started about 2 weeks ago and has gradually been getting worse. The pain will wrap around to her left groin. She now occasionally has pain at right lower back that radiates to her right lateral hip. Pain is worse with the act of sitting, however, she states that she is now able to transition between sitting and standing much more comfortably.  No bowel or bladder incontinence. No numbness, tingling, weakness in her lower extremities. She denies any injury or trauma. 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 [employer] has allowed her to be on light duty and she feels now that she can return to full unrestricted duty.   Patient is a 29 y.o. female presenting with back pain. The history is provided by the patient.  Back Pain   Past Medical History  Diagnosis Date  . Asthma   . Obesity    Past Surgical History  Procedure Laterality Date  . Cholecystectomy  2009   Family History  Problem Relation Age of Onset  . Hypertension Mother    History  Substance Use Topics  . Smoking status: Former Smoker    Types: Cigars    Quit date: 08/24/2013  . Smokeless tobacco: Not on file  . Alcohol Use: Yes     Comment: Occasional    OB History    No data available     Review of Systems  Musculoskeletal: Positive for back pain.  All other systems reviewed and are  negative.   Allergies  Review of patient's allergies indicates no known allergies.  Home Medications   Prior to Admission medications   Medication Sig Start Date End Date Taking? Authorizing Provider  albuterol (PROVENTIL HFA;VENTOLIN HFA) 108 (90 BASE) MCG/ACT inhaler Inhale 2 puffs into the lungs every 6 (six) hours as needed for wheezing or shortness of breath.    Historical Provider, MD  meloxicam (MOBIC) 15 MG tablet Take 1 tablet (15 mg total) by mouth daily. 07/30/14   Charm RingsErin J Honig, MD  predniSONE (DELTASONE) 50 MG tablet Take 1 pill daily for 5 days. 07/30/14   Charm RingsErin J Honig, MD  traMADol (ULTRAM) 50 MG tablet Take 1 tablet (50 mg total) by mouth every 6 (six) hours as needed. 07/30/14   Charm RingsErin J Honig, MD   BP 123/87 mmHg  Pulse 73  Temp(Src) 98.9 F (37.2 C) (Oral)  Resp 17  SpO2 99%  LMP 07/26/2014 Physical Exam  Constitutional: She is oriented to person, place, and time. She appears well-developed and well-nourished. No distress.  +obese  HENT:  Head: Normocephalic and atraumatic.  Cardiovascular: Normal rate, regular rhythm and normal heart sounds.   Pulmonary/Chest: Effort normal and breath sounds normal.  Musculoskeletal: Normal range of motion.  +SLR B/L  Neurological: She is alert and oriented to person, place, and time. She has normal strength. No sensory deficit. Coordination and gait normal.  Reflex Scores:  Patellar reflexes are 1+ on the right side and 1+ on the left side. Skin: Skin is warm and dry.  Psychiatric: She has a normal mood and affect. Her behavior is normal.  Nursing note and vitals reviewed.   ED Course  Procedures (including critical care time) Labs Review Labs Reviewed  POCT URINALYSIS DIP (DEVICE) - Abnormal; Notable for the following:    Leukocytes, UA SMALL (*)    All other components within normal limits  POCT PREGNANCY, URINE    Imaging Review No results found.   MDM   1. Bilateral low back pain with left-sided sciatica    Advised to continue Mobic as prescribed. May use tylenol in addition for discomfort. May return to full duty at work. Advised follow up with [facility] if symptoms persist or worsen.    Ria Clock, [place] 08/06/14 1555

## 2014-12-11 ENCOUNTER — Telehealth: Payer: Self-pay

## 2014-12-11 ENCOUNTER — Emergency Department (HOSPITAL_COMMUNITY)
Admission: EM | Admit: 2014-12-11 | Discharge: 2014-12-11 | Disposition: A | Discharge status: Home / Self Care | Payer: No Typology Code available for payment source | Source: Home / Self Care | Attending: Physician Assistant | Admitting: Physician Assistant

## 2014-12-11 ENCOUNTER — Encounter (HOSPITAL_COMMUNITY): Payer: Self-pay | Admitting: Emergency Medicine

## 2014-12-11 DIAGNOSIS — B86 Scabies: Secondary | ICD-10-CM | POA: Diagnosis not present

## 2014-12-11 DIAGNOSIS — R51 Headache: Secondary | ICD-10-CM

## 2014-12-11 DIAGNOSIS — R519 Headache, unspecified: Secondary | ICD-10-CM

## 2014-12-11 MED ORDER — PERMETHRIN 5 % EX CREA: TOPICAL_CREAM | CUTANEOUS | Status: AC

## 2014-12-11 NOTE — Telephone Encounter (Signed)
Pt states that the medication she was given today is too expensive and would like something else called in

## 2014-12-11 NOTE — ED Notes (Signed)
The patient reported to the [facility] stating that she had a rash that started Saturday and she subsequently found out that her nephew was diagnosed with scabies. She also stated that she has been waking in the night with a migraine headache for around 2 weeks.

## 2014-12-11 NOTE — Discharge Instructions (Signed)
Apply the permethrin cream all over your body from neck to toes followed by showering 8-14 hours later. Be sure to wash all your bedding and linens in hot water today.  I would recommend anyone who lives with you being seen for possible scabies as well. You can return to work on Friday after applying the cream today and showering tomorrow.  Please follow up with your pcp for the headaches. These could be from possible sleep apnea and should be looked into more.  Taking ibuprofen or tylenol for the headaches should help in the meantime.

## 2014-12-11 NOTE — ED Provider Notes (Signed)
CSN: [id number]     Arrival date & time 12/11/14  1607 History   First MD Initiated Contact with Patient 12/11/14 1642     Chief Complaint  Patient presents with  . Rash  . Migraine    HPI  29 yof presents with rash and headache.   Rash started 4-5 days ago. Itchy rash started bilateral lower arms has since moved up arms, up legs and along left flank. Had been babysitting nephew prior to this who was then diagnosed with scabies. Denies new lotions, detergents, linens, foods, meds. Denies fevers, chills.   Headache - Most mornings over past month wakes with headache. Described as frontal and rated moderate. Relieves with excedrin. Denies assoc fevers, chills, vision changes, or aura. Denies photo/phonophobia. Denies neck stiffness. Does not drink much water. Admits to snoring and states she has large tonsils.   Past Medical History  Diagnosis Date  . Asthma   . Obesity    Past Surgical History  Procedure Laterality Date  . Cholecystectomy  2009   Family History  Problem Relation Age of Onset  . Hypertension Mother    Social History  Substance Use Topics  . Smoking status: Former Smoker    Types: Cigars    Quit date: 08/24/2013  . Smokeless tobacco: None  . Alcohol Use: Yes     Comment: Occasional    OB History    No data available     Review of Systems See HPI  Allergies  Review of patient's allergies indicates no known allergies.  Home Medications   Prior to Admission medications   Medication Sig Start Date End Date Taking? Authorizing Provider  albuterol (PROVENTIL HFA;VENTOLIN HFA) 108 (90 BASE) MCG/ACT inhaler Inhale 2 puffs into the lungs every 6 (six) hours as needed for wheezing or shortness of breath.    Historical Provider, MD  meloxicam (MOBIC) 15 MG tablet Take 1 tablet (15 mg total) by mouth daily. 07/30/14   Charm Rings, MD  permethrin (ELIMITE) 5 % cream Apply to affected area once 12/11/14   Raelyn Ensign, PA  predniSONE (DELTASONE) 50 MG tablet Take  1 pill daily for 5 days. 07/30/14   Charm Rings, MD  traMADol (ULTRAM) 50 MG tablet Take 1 tablet (50 mg total) by mouth every 6 (six) hours as needed. 07/30/14   Charm Rings, MD   Meds Ordered and Administered this Visit  Medications - No data to display  BP 129/79 mmHg  Pulse 84  Temp(Src) 99.1 F (37.3 C) (Oral)  Resp 16  SpO2 98%  LMP 12/01/2014 No data found.   Physical Exam  Constitutional: She is oriented to person, place, and time.  BP 129/79 mmHg  Pulse 84  Temp(Src) 99.1 F (37.3 C) (Oral)  Resp 16  SpO2 98%  LMP 12/01/2014   HENT:  Mouth/Throat: Uvula is midline, oropharynx is clear and moist and mucous membranes are normal.  Tonsils 3+ bilaterally.   Eyes: Conjunctivae and EOM are normal. Pupils are equal, round, and reactive to light.  Neck: No Brudzinski's sign noted.  Neurological: She is alert and oriented to person, place, and time. No cranial nerve deficit.  Skin:  Small erythematous papules bilateral arms and legs. Left upper extremity has several linear distributions of papules. No webbing involvement. Excoriations across several areas.       ED Course  Procedures (including critical care time)  Labs Review Labs Reviewed - No data to display  Imaging Review No results found.  MDM   1. Scabies   2. Morning headache, controlled    Permethrin, wash linens, benadryl at night for itching. Recommend family members in home be checked.  For headache - suspect possible osa with morning headaches, snoring, enlarge tonsils, and obesity. Also could be due to dehydration or tension ha. Continue nsaids prn. F/U with pcp for possible sleep study referral.    Raelyn Ensign, PA 12/11/14 1737

## 2014-12-12 NOTE — Telephone Encounter (Signed)
Called and spoke with pt and she stated that she got a coupon for the cream that was given to her and she was able to pick the cream up. Nothing further is needed.

## 2015-01-15 ENCOUNTER — Emergency Department (HOSPITAL_COMMUNITY): Payer: No Typology Code available for payment source

## 2015-01-15 ENCOUNTER — Emergency Department (HOSPITAL_COMMUNITY)
Admission: EM | Admit: 2015-01-15 | Discharge: 2015-01-15 | Disposition: A | Discharge status: Home / Self Care | Payer: No Typology Code available for payment source | Attending: Emergency Medicine | Admitting: Emergency Medicine

## 2015-01-15 ENCOUNTER — Encounter (HOSPITAL_COMMUNITY): Payer: Self-pay

## 2015-01-15 DIAGNOSIS — Z87891 Personal history of nicotine dependence: Secondary | ICD-10-CM | POA: Diagnosis not present

## 2015-01-15 DIAGNOSIS — E669 Obesity, unspecified: Secondary | ICD-10-CM | POA: Insufficient documentation

## 2015-01-15 DIAGNOSIS — R0602 Shortness of breath: Secondary | ICD-10-CM | POA: Diagnosis present

## 2015-01-15 DIAGNOSIS — B349 Viral infection, unspecified: Secondary | ICD-10-CM | POA: Insufficient documentation

## 2015-01-15 DIAGNOSIS — Z79899 Other long term (current) drug therapy: Secondary | ICD-10-CM | POA: Insufficient documentation

## 2015-01-15 DIAGNOSIS — J45901 Unspecified asthma with (acute) exacerbation: Secondary | ICD-10-CM | POA: Diagnosis not present

## 2015-01-15 MED ORDER — KETOROLAC TROMETHAMINE 60 MG/2ML IM SOLN
60.0000 mg | Freq: Once | INTRAMUSCULAR | Status: AC
  Administered 2015-01-15: 60 mg via INTRAMUSCULAR
  Filled 2015-01-15: qty 2

## 2015-01-15 NOTE — Discharge Instructions (Signed)
Viral Infections °A viral infection can be caused by different types of viruses. Most viral infections are not serious and resolve on their own. However, some infections may cause severe symptoms and may lead to further complications. °SYMPTOMS °Viruses can frequently cause: °· Minor sore throat. °· Aches and pains. °· Headaches. °· Runny nose. °· Different types of rashes. °· Watery eyes. °· Tiredness. °· Cough. °· Loss of appetite. °· Gastrointestinal infections, resulting in nausea, vomiting, and diarrhea. °These symptoms do not respond to antibiotics because the infection is not caused by bacteria. However, you might catch a bacterial infection following the viral infection. This is sometimes called a "superinfection." Symptoms of such a bacterial infection may include: °· Worsening sore throat with pus and difficulty swallowing. °· Swollen neck glands. °· Chills and a high or persistent fever. °· Severe headache. °· Tenderness over the sinuses. °· Persistent overall ill feeling (malaise), muscle aches, and tiredness (fatigue). °· Persistent cough. °· Yellow, green, or brown mucus production with coughing. °HOME CARE INSTRUCTIONS  °· Only take over-the-counter or prescription medicines for pain, discomfort, diarrhea, or fever as directed by your caregiver. °· Drink enough water and fluids to keep your urine clear or pale yellow. Sports drinks can provide valuable electrolytes, sugars, and hydration. °· Get plenty of rest and maintain proper nutrition. Soups and broths with crackers or rice are fine. °SEEK IMMEDIATE MEDICAL CARE IF:  °· You have severe headaches, shortness of breath, chest pain, neck pain, or an unusual rash. °· You have uncontrolled vomiting, diarrhea, or you are unable to keep down fluids. °· You or your child has an oral temperature above 102° F (38.9° C), not controlled by medicine. °· Your baby is older than 3 months with a rectal temperature of 102° F (38.9° C) or higher. °· Your baby is 3  months old or younger with a rectal temperature of 100.4° F (38° C) or higher. °MAKE SURE YOU:  °· Understand these instructions. °· Will watch your condition. °· Will get help right away if you are not doing well or get worse. °  °This information is not intended to replace advice given to you by your health care provider. Make sure you discuss any questions you have with your health care provider. °  °Document Released: 01/06/2005 Document Revised: 06/21/2011 Document Reviewed: 09/04/2014 °Elsevier Interactive Patient Education ©2016 Elsevier Inc. ° °

## 2015-01-15 NOTE — ED Notes (Signed)
Patient C/O SOB, body aches, chills, ran a fever yesterday at PCP 102.8.  Strep and flu negative per PCP.  Patient C/O coughing and pain in the chest that goes to left lower back.  Patient C/O pain left lower back on inspiration.

## 2015-01-15 NOTE — ED Provider Notes (Signed)
Medical screening examination/treatment/procedure(s) were conducted as a shared visit with non-physician practitioner(s) and myself.  I personally evaluated the patient during the encounter.  Sob, fever, systemic symptoms for a day or two. Exam with her lungs, normal heart sounds. We'll do chest x-ray and workup appropriately. Low risk for PE, ACS or other emergent causes. Likely viral syndrome and patient will be stable for discharge.   Marily Memos, MD 01/15/15 [phone]

## 2015-01-15 NOTE — ED Provider Notes (Signed)
CSN: [id number]     Arrival date & time 01/15/15  1519 History   First MD Initiated Contact with Patient 01/15/15 1544     Chief Complaint  Patient presents with  . Shortness of Breath     (Consider location/radiation/quality/duration/timing/severity/associated sxs/prior Treatment) HPI Diane Wong is a 29 y.o. female with a history of asthma and obesity comes in for evaluation of shortness of breath. Patient states she is having some shortness of breath today that started earlier this morning around 8:00 AM. She reports that Sunday she has had a pressure sensation in her right chest and in her right back that is worsened with coughing. Patient reports intermittent dry cough and associated body aches. She reports a fever of 102.8 yesterday, was seen by her PCP and prescribed Tamiflu. Patient reports she did not fill this prescription. She has taken Tylenol at home with some relief of her symptoms. She denies chills, nausea or vomiting, abdominal pain, hemoptysis, leg swelling or pain, recent travel or surgeries.  Past Medical History  Diagnosis Date  . Asthma   . Obesity    Past Surgical History  Procedure Laterality Date  . Cholecystectomy  2009   Family History  Problem Relation Age of Onset  . Hypertension Mother    Social History  Substance Use Topics  . Smoking status: Former Smoker    Types: Cigars    Quit date: 08/24/2013  . Smokeless tobacco: None  . Alcohol Use: Yes     Comment: Occasional    OB History    No data available     Review of Systems A 10 point review of systems was completed and was negative except for pertinent positives and negatives as mentioned in the history of present illness     Allergies  Review of patient's allergies indicates no known allergies.  Home Medications   Prior to Admission medications   Medication Sig Start Date End Date Taking? Authorizing Provider  Norethin Ace-Eth Estrad-FE (MICROGESTIN FE 1/20 PO) Take 1 tablet by  mouth daily.   Yes Historical Provider, MD  oseltamivir (TAMIFLU) 75 MG capsule Take 75 mg by mouth 2 (two) times daily. 01/14/15 01/19/15 Yes Historical Provider, MD  Phenylephrine-DM-GG-APAP (TYLENOL COLD/FLU SEVERE PO) Take 30 mLs by mouth every 4 (four) hours as needed (cold symptoms).   Yes Historical Provider, MD  meloxicam (MOBIC) 15 MG tablet Take 1 tablet (15 mg total) by mouth daily. Patient not taking: Reported on 01/15/2015 07/30/14   Charm Rings, MD  permethrin (ELIMITE) 5 % cream Apply to affected area once Patient not taking: Reported on 01/15/2015 12/11/14   Raelyn Ensign, PA  predniSONE (DELTASONE) 50 MG tablet Take 1 pill daily for 5 days. Patient not taking: Reported on 01/15/2015 07/30/14   Charm Rings, MD  traMADol (ULTRAM) 50 MG tablet Take 1 tablet (50 mg total) by mouth every 6 (six) hours as needed. Patient not taking: Reported on 01/15/2015 07/30/14   Charm Rings, MD   BP 110/79 mmHg  Pulse 88  Temp(Src) 98.2 F (36.8 C) (Oral)  Resp 18  SpO2 100%  LMP 01/15/2015 Physical Exam  Constitutional: She is oriented to person, place, and time. She appears well-developed and well-nourished.  Obese African-American female.  HENT:  Head: Normocephalic and atraumatic.  Mouth/Throat: Oropharynx is clear and moist.  Eyes: Conjunctivae are normal. Pupils are equal, round, and reactive to light. Right eye exhibits no discharge. Left eye exhibits no discharge. No scleral icterus.  Neck: Neck  supple.  Cardiovascular: Normal rate, regular rhythm and normal heart sounds.   Pulmonary/Chest: Effort normal and breath sounds normal. No respiratory distress. She has no wheezes. She has no rales. She exhibits tenderness.    Tenderness to direct palpation of her right pectoral muscle and intercostal spaces. No lesions or deformities. No crepitus or bony step-offs.  Abdominal: Soft. There is no tenderness.  Musculoskeletal: She exhibits no tenderness.  Neurological: She is alert and oriented  to person, place, and time.  Cranial Nerves II-XII grossly intact  Skin: Skin is warm and dry. No rash noted.  Psychiatric: She has a normal mood and affect.  Nursing note and vitals reviewed.   ED Course  Procedures (including critical care time) Labs Review Labs Reviewed - No data to display  Imaging Review Dg Chest 2 View  01/15/2015   CLINICAL DATA:  Shortness of breath.  EXAM: CHEST  2 VIEW  COMPARISON:  October 06, 2010.  FINDINGS: The heart size and mediastinal contours are within normal limits. Both lungs are clear. No pneumothorax or pleural effusion is noted. The visualized skeletal structures are unremarkable.  IMPRESSION: No active cardiopulmonary disease.   Electronically Signed   By: Lupita Raider, M.D.   On: 01/15/2015 16:16   I have personally reviewed and evaluated these images and lab results as part of my medical decision-making.   EKG Interpretation None     Meds given in ED:  Medications  ketorolac (TORADOL) injection 60 mg (not administered)    New Prescriptions   No medications on file   Filed Vitals:   01/15/15 1525 01/15/15 1651  BP: 124/73 110/79  Pulse: 98 88  Temp: 98.2 F (36.8 C)   TempSrc: Oral   Resp:  18  SpO2: 100% 100%    MDM  Vitals stable - WNL -afebrile Pt resting comfortably in ED. PE--physical exam is grossly unremarkable. Tenderness over right pectoral muscle and intercostal spaces. Otherwise, benign cardiopulmonary exam. Negative peripheral vascular exam. Imaging--chest x-ray shows no acute cardio pulmonary pathology.  DDX--patient with likely viral syndrome. Does have asthma and does have an inhaler at home, encouraged to use as needed. Low suspicion for PE, low Wells score. Low concern for other acute cardiopulmonary pathology. Discussed symptomatic treatment at home with Tylenol and Motrin. Discussed adequate fluid hydration.  I discussed all relevant lab findings and imaging results with pt and they verbalized  understanding. Discussed f/u with PCP within 48 hrs and return precautions, pt very amenable to plan.  Final diagnoses:  Viral syndrome        Joycie Peek, PA-C 01/15/15 1705  Marily Memos, MD 01/15/15 2330

## 2015-08-01 ENCOUNTER — Emergency Department (HOSPITAL_COMMUNITY)
Admission: EM | Admit: 2015-08-01 | Discharge: 2015-08-01 | Disposition: A | Discharge status: Home / Self Care | Payer: BLUE CROSS/BLUE SHIELD | Attending: Emergency Medicine | Admitting: Emergency Medicine

## 2015-08-01 ENCOUNTER — Encounter (HOSPITAL_COMMUNITY): Payer: Self-pay | Admitting: Emergency Medicine

## 2015-08-01 DIAGNOSIS — K0889 Other specified disorders of teeth and supporting structures: Secondary | ICD-10-CM | POA: Diagnosis not present

## 2015-08-01 DIAGNOSIS — J45909 Unspecified asthma, uncomplicated: Secondary | ICD-10-CM | POA: Diagnosis not present

## 2015-08-01 DIAGNOSIS — Z87891 Personal history of nicotine dependence: Secondary | ICD-10-CM | POA: Diagnosis not present

## 2015-08-01 DIAGNOSIS — K029 Dental caries, unspecified: Secondary | ICD-10-CM | POA: Diagnosis not present

## 2015-08-01 DIAGNOSIS — E669 Obesity, unspecified: Secondary | ICD-10-CM | POA: Diagnosis not present

## 2015-08-01 NOTE — ED Notes (Signed)
Pt presents to ED for assessment of dental abscess to left upper mouth.  Pt c/o pain to the left face, pain with swallowing.  Pt sts she went to a dentist and an oral surgeon and could not get her dental insurance to pay for the surgery.  Pt sts she was told by her medical insurance to come to ED for assessment/assistance.

## 2015-08-01 NOTE — ED Notes (Signed)
Patient able to ambulate independently  

## 2015-08-01 NOTE — ED Provider Notes (Signed)
CSN: [id number]     Arrival date & time 08/01/15  1543 History  By signing my name below, I, Diane Wong, attest that this documentation has been prepared under the direction and in the presence of [facility] Orlene Och, NP. Electronically Signed: Evon Wong, ED Scribe. 08/01/2015. 4:26 PM.    Chief Complaint  Patient presents with  . Dental Pain   Patient is a 30 y.o. female presenting with tooth pain. The history is provided by the patient. No language interpreter was used.  Dental Pain Location:  Upper and lower Severity:  Moderate Duration:  3 weeks Timing:  Constant Progression:  Unchanged Context: abscess and dental caries   Relieved by:  NSAIDs Ineffective treatments:  NSAIDs Associated symptoms: no facial swelling and no fever    HPI Comments: Diane Wong is a 30 y.o. female who presents to the Emergency Department complaining of left upper and left lower dental pain onset 3 weeks prior. Pt states that she has dental caries and possible abscess. Pt states she has seen an oral surgeon but her insurance was not able to cover the procedure. Pt states she has tried antibiotics (clindamyacin and amoxicillin) with no relief. Pt states she has been complaint with taking the antibiotics. Pt states she has tried ibuprofen and Vicodin with only temporarily relief.   Past Medical History  Diagnosis Date  . Asthma   . Obesity    Past Surgical History  Procedure Laterality Date  . Cholecystectomy  2009   Family History  Problem Relation Age of Onset  . Hypertension Mother    Social History  Substance Use Topics  . Smoking status: Former Smoker    Types: Cigars    Quit date: 08/24/2013  . Smokeless tobacco: None  . Alcohol Use: Yes     Comment: Occasional    OB History    No data available     Review of Systems  Constitutional: Negative for fever.  HENT: Positive for dental problem. Negative for facial swelling.   All other systems reviewed and are  negative.     Allergies  Review of patient's allergies indicates no known allergies.  Home Medications   Prior to Admission medications   Medication Sig Start Date End Date Taking? Authorizing Provider  meloxicam (MOBIC) 15 MG tablet Take 1 tablet (15 mg total) by mouth daily. Patient not taking: Reported on 01/15/2015 07/30/14   Charm Rings, MD  Norethin Ace-Eth Estrad-FE (MICROGESTIN FE 1/20 PO) Take 1 tablet by mouth daily.    Historical Provider, MD  permethrin (ELIMITE) 5 % cream Apply to affected area once Patient not taking: Reported on 01/15/2015 12/11/14   Raelyn Ensign, PA  Phenylephrine-DM-GG-APAP (TYLENOL COLD/FLU SEVERE PO) Take 30 mLs by mouth every 4 (four) hours as needed (cold symptoms).    Historical Provider, MD  predniSONE (DELTASONE) 50 MG tablet Take 1 pill daily for 5 days. Patient not taking: Reported on 01/15/2015 07/30/14   Charm Rings, MD  traMADol (ULTRAM) 50 MG tablet Take 1 tablet (50 mg total) by mouth every 6 (six) hours as needed. Patient not taking: Reported on 01/15/2015 07/30/14   Charm Rings, MD   BP 134/67 mmHg  Pulse 72  Temp(Src) 98.7 F (37.1 C) (Oral)  Resp 16  SpO2 98%  LMP 07/24/2015   Physical Exam  Constitutional: She is oriented to person, place, and time. She appears well-developed and well-nourished. No distress.  HENT:  Head: Normocephalic and atraumatic.  Mouth/Throat: Uvula is midline,  oropharynx is clear and moist and mucous membranes are normal.    Decay and tenderness to the upper and lower left 3rd molars.   Eyes: Conjunctivae and EOM are normal.  Neck: Neck supple. No tracheal deviation present.  Cardiovascular: Normal rate.   Pulmonary/Chest: Effort normal. No respiratory distress.  Musculoskeletal: Normal range of motion.  Lymphadenopathy:    She has no cervical adenopathy.  Neurological: She is alert and oriented to person, place, and time.  Skin: Skin is warm and dry.  Psychiatric: She has a normal mood and affect.  Her behavior is normal.  Nursing note and vitals reviewed.   ED Course  Procedures (including critical care time) DIAGNOSTIC STUDIES: Oxygen Saturation is 98% on RA, normal by my interpretation.    COORDINATION OF CARE: 4:22 PM-Discussed treatment plan with pt at bedside and pt agreed to plan.  Dental block using dental marcaine. Patient did receive relief of pain. Discussed that this would only be a temporary relief.    MDM  30 y.o. female with dental pain due to caries stable for d/c without facial cellulitis, difficulty swallowing or visible abscess. She will continue her antibiotics and pain medication. [facility]. She will call for follow up.   Final diagnoses:  Pain due to dental caries   I personally performed the services described in this documentation, which was scribed in my presence. The recorded information has been reviewed and is accurate.      Diane PintoHope M Azlynn Wong, [place]NP 08/01/15 1830  Diane BerkshireJoseph Zammit, MD 08/04/15 [phone]

## 2015-08-01 NOTE — Discharge Instructions (Signed)
Call the [facility] to discuss taking out your wisdom teeth.

## 2016-01-19 ENCOUNTER — Encounter (HOSPITAL_COMMUNITY): Payer: Self-pay | Admitting: Emergency Medicine

## 2016-01-19 ENCOUNTER — Emergency Department (HOSPITAL_COMMUNITY)
Admission: EM | Admit: 2016-01-19 | Discharge: 2016-01-19 | Disposition: A | Discharge status: Home / Self Care | Payer: BLUE CROSS/BLUE SHIELD | Attending: Emergency Medicine | Admitting: Emergency Medicine

## 2016-01-19 ENCOUNTER — Emergency Department (HOSPITAL_COMMUNITY): Payer: BLUE CROSS/BLUE SHIELD

## 2016-01-19 DIAGNOSIS — N390 Urinary tract infection, site not specified: Secondary | ICD-10-CM | POA: Insufficient documentation

## 2016-01-19 DIAGNOSIS — Z79899 Other long term (current) drug therapy: Secondary | ICD-10-CM | POA: Insufficient documentation

## 2016-01-19 DIAGNOSIS — R300 Dysuria: Secondary | ICD-10-CM | POA: Diagnosis present

## 2016-01-19 DIAGNOSIS — Z87891 Personal history of nicotine dependence: Secondary | ICD-10-CM | POA: Diagnosis not present

## 2016-01-19 DIAGNOSIS — R0600 Dyspnea, unspecified: Secondary | ICD-10-CM | POA: Diagnosis not present

## 2016-01-19 DIAGNOSIS — J45909 Unspecified asthma, uncomplicated: Secondary | ICD-10-CM | POA: Diagnosis not present

## 2016-01-19 LAB — CBC
HCT: 35.1 % — ABNORMAL LOW (ref 36.0–46.0)
Hemoglobin: 11.4 g/dL — ABNORMAL LOW (ref 12.0–15.0)
MCH: 29.5 pg (ref 26.0–34.0)
MCHC: 32.5 g/dL (ref 30.0–36.0)
MCV: 90.7 fL (ref 78.0–100.0)
PLATELETS: 373 10*3/uL (ref 150–400)
RBC: 3.87 MIL/uL (ref 3.87–5.11)
RDW: 13.9 % (ref 11.5–15.5)
WBC: 7.2 10*3/uL (ref 4.0–10.5)

## 2016-01-19 LAB — BASIC METABOLIC PANEL
Anion gap: 6 (ref 5–15)
BUN: 11 mg/dL (ref 6–20)
CALCIUM: 8.9 mg/dL (ref 8.9–10.3)
CO2: 25 mmol/L (ref 22–32)
CREATININE: 0.94 mg/dL (ref 0.44–1.00)
Chloride: 112 mmol/L — ABNORMAL HIGH (ref 101–111)
GFR calc non Af Amer: >60 mL/min (ref >60)
Glucose, Bld: 100 mg/dL — ABNORMAL HIGH (ref 65–99)
Potassium: 4 mmol/L (ref 3.5–5.1)
SODIUM: 143 mmol/L (ref 135–145)

## 2016-01-19 LAB — URINALYSIS, ROUTINE W REFLEX MICROSCOPIC
Bilirubin Urine: NEGATIVE
Glucose, UA: NEGATIVE mg/dL
KETONES UR: NEGATIVE mg/dL
NITRITE: NEGATIVE
PROTEIN: NEGATIVE mg/dL
Specific Gravity, Urine: 1.024 (ref 1.005–1.030)
pH: 7.5 (ref 5.0–8.0)

## 2016-01-19 LAB — URINE MICROSCOPIC-ADD ON

## 2016-01-19 LAB — D-DIMER, QUANTITATIVE: D-Dimer, Quant: 0.28 ug/mL-FEU (ref 0.00–0.50)

## 2016-01-19 LAB — I-STAT BETA HCG BLOOD, ED (MC, WL, AP ONLY)

## 2016-01-19 MED ORDER — NITROFURANTOIN MONOHYD MACRO 100 MG PO CAPS: 100.0000 mg | ORAL_CAPSULE | Freq: Two times a day (BID) | ORAL | 0 refills | Status: AC

## 2016-01-19 NOTE — Discharge Instructions (Signed)
Take Macrobid as prescribed until all gone. Keep her appointment tomorrow and follow-up with your doctor for further evaluation of your shortness of breath. Return if worsening symptoms.

## 2016-01-19 NOTE — ED Provider Notes (Signed)
[facility]-EMERGENCY DEPT Provider Note   CSN: [id number] Arrival date & time: 01/19/16  1639     History   Chief Complaint Chief Complaint  Patient presents with  . Dysuria  . Hematuria  . Shortness of Breath    HPI Diane Wong is a 30 y.o. female.  HPI Diane Wong is a 30 y.o. female who presents to emergency department complaining of shortness of breath, hematuria, dysuria. Patient states her symptoms started approximately 3 days ago. She reports tightness sensation in her chest, and pain and bilateral flank. She denies any pain with deep breathing. She denies any cough. She denies wheezing or congestion. She states she is having some dysuria and has seen some blood on the tissue when she is wiping. She denies any urinary frequency or urgency. She denies any fever or chills. No nausea or vomiting. No vaginal discharge or bleeding. She's not concerned about STI.  Past Medical History:  Diagnosis Date  . Asthma   . Obesity     There are no active problems to display for this patient.   Past Surgical History:  Procedure Laterality Date  . CHOLECYSTECTOMY  2009    OB History    No data available       Home Medications    Prior to Admission medications   Medication Sig Start Date End Date Taking? Authorizing Provider  meloxicam (MOBIC) 15 MG tablet Take 1 tablet (15 mg total) by mouth daily. Patient not taking: Reported on 01/15/2015 07/30/14   Charm Rings, MD  Norethin Ace-Eth Estrad-FE (MICROGESTIN FE 1/20 PO) Take 1 tablet by mouth daily.    Historical Provider, MD  permethrin (ELIMITE) 5 % cream Apply to affected area once Patient not taking: Reported on 01/15/2015 12/11/14   Raelyn Ensign, PA  Phenylephrine-DM-GG-APAP (TYLENOL COLD/FLU SEVERE PO) Take 30 mLs by mouth every 4 (four) hours as needed (cold symptoms).    Historical Provider, MD  predniSONE (DELTASONE) 50 MG tablet Take 1 pill daily for 5 days. Patient not taking: Reported on 01/15/2015 07/30/14   Charm Rings, MD  traMADol (ULTRAM) 50 MG tablet Take 1 tablet (50 mg total) by mouth every 6 (six) hours as needed. Patient not taking: Reported on 01/15/2015 07/30/14   Charm Rings, MD    Family History Family History  Problem Relation Age of Onset  . Hypertension Mother     Social History Social History  Substance Use Topics  . Smoking status: Former Smoker    Types: Cigars    Quit date: 08/24/2013  . Smokeless tobacco: Never Used  . Alcohol use Yes     Comment: Occasional      Allergies   Review of patient's allergies indicates no known allergies.   Review of Systems Review of Systems  Constitutional: Negative for chills and fever.  Respiratory: Positive for shortness of breath. Negative for cough and chest tightness.   Cardiovascular: Negative for chest pain, palpitations and leg swelling.  Gastrointestinal: Negative for abdominal pain, diarrhea, nausea and vomiting.  Genitourinary: Positive for dysuria and hematuria. Negative for flank pain, pelvic pain, vaginal bleeding, vaginal discharge and vaginal pain.  Musculoskeletal: Negative for arthralgias, myalgias, neck pain and neck stiffness.  Skin: Negative for rash.  Neurological: Negative for dizziness, weakness and headaches.  All other systems reviewed and are negative.    Physical Exam Updated Vital Signs BP 131/88 (BP Location: Right Arm)   Pulse 89   Temp 98.4 F (36.9 C) (Oral)   Resp  19   Ht 6\' 2"  (1.88 m)   Wt (!) 170.1 kg   LMP 01/12/2016   SpO2 98%   BMI 48.15 kg/m   Physical Exam  Constitutional: She appears well-developed and well-nourished. No distress.  HENT:  Head: Normocephalic.  Eyes: Conjunctivae are normal.  Neck: Neck supple.  Cardiovascular: Normal rate, regular rhythm and normal heart sounds.   Pulmonary/Chest: Effort normal and breath sounds normal. No respiratory distress. She has no wheezes. She has no rales. She exhibits no tenderness.  Abdominal: Soft. Bowel sounds are normal. She  exhibits no distension. There is no tenderness. There is no rebound and no guarding.  No CVA tenderness bilaterally  Musculoskeletal: She exhibits no edema.  Neurological: She is alert.  Skin: Skin is warm and dry.  Psychiatric: She has a normal mood and affect. Her behavior is normal.  Nursing note and vitals reviewed.    ED Treatments / Results  Labs (all labs ordered are listed, but only abnormal results are displayed) Labs Reviewed  URINALYSIS, ROUTINE W REFLEX MICROSCOPIC (NOT AT [facility]) - Abnormal; Notable for the following:       Result Value   APPearance CLOUDY (*)    Hgb urine dipstick TRACE (*)    Leukocytes, UA SMALL (*)    All other components within normal limits  BASIC METABOLIC PANEL - Abnormal; Notable for the following:    Chloride 112 (*)    Glucose, Bld 100 (*)    All other components within normal limits  CBC - Abnormal; Notable for the following:    Hemoglobin 11.4 (*)    HCT 35.1 (*)    All other components within normal limits  URINE MICROSCOPIC-ADD ON - Abnormal; Notable for the following:    Squamous Epithelial / LPF 0-5 (*)    Bacteria, UA MANY (*)    All other components within normal limits  D-DIMER, QUANTITATIVE (NOT AT [facility])  I-STAT BETA HCG BLOOD, ED ([facility], [facility], [facility] ONLY)    EKG  EKG Interpretation  Date/Time:  Monday January 19 2016 16:54:34 EDT Ventricular Rate:  88 PR Interval:    QRS Duration: 83 QT Interval:  374 QTC Calculation: 453 R Axis:   23 Text Interpretation:  Sinus rhythm Low voltage, precordial leads since last tracing no significant change Confirmed by Effie Shy  MD, ELLIOTT [id number]) on 01/19/2016 6:39:36 PM       Radiology Dg Chest 2 View  Result Date: 01/19/2016 CLINICAL DATA:  Shortness of breath for 1 day.  History of asthma. EXAM: CHEST  2 VIEW COMPARISON:  01/15/2015 FINDINGS: The heart size and mediastinal contours are within normal limits. Both lungs are clear. The visualized skeletal structures are unremarkable.  IMPRESSION: No active cardiopulmonary disease. Electronically Signed   By: Richarda Overlie M.D.   On: 01/19/2016 18:32    Procedures Procedures (including critical care time)  Medications Ordered in ED Medications - No data to display   Initial Impression / Assessment and Plan / ED Course  I have reviewed the triage vital signs and the nursing notes.  Pertinent labs & imaging results that were available during my care of the patient were reviewed by me and considered in my medical decision making (see chart for details).  Clinical Course   Pt in ED with shortness of breath, hematuria, dysuria. Pt in NAD. Normal VS, not hypoxic, not tachypnec or tachycardic. Will check labs, d dimer, chest xray. UA already obtained at triage, showing hematuria, many bacteria. Suspect UT. Pt again  stated she does not have any vaginal discharge or bleeding. No concern for STI.   7:36 PM Labs unremarkable including negative d-dimer. Chest x-ray negative. Again vital signs are normal. I do not suspect this patient has ACS, doubt PE with negative d-dimer and normal vital signs. Possible mild asthma exacerbation, however at this time no wheezing. Chest x-ray is clear. Patient is in no acute distress, specifically no respiratory distress, will discharge home with close patient follow-up. She has an appointment tomorrow. Will cover with Macrobid for UTI.  Vitals:   01/19/16 1650 01/19/16 1651 01/19/16 1900  BP:  131/88 118/87  Pulse:  89 80  Resp:  19 18  Temp:  98.4 F (36.9 C) 98.2 F (36.8 C)  TempSrc:  Oral Oral  SpO2:  98% 97%  Weight: (!) 170.1 kg    Height: 6\' 2"  (1.88 m)       Final Clinical Impressions(s) / ED Diagnoses   Final diagnoses:  Urinary tract infection without hematuria, site unspecified  Dyspnea, unspecified type    New Prescriptions Discharge Medication List as of 01/19/2016  7:43 PM    START taking these medications   Details  nitrofurantoin, macrocrystal-monohydrate,  (MACROBID) 100 MG capsule Take 1 capsule (100 mg total) by mouth 2 (two) times daily., Starting Mon 01/19/2016, Print         Jaynie Crumbleatyana [place] Cawood, PA-C 01/19/16 2015    Bethann BerkshireJoseph Zammit, MD 01/19/16 2225

## 2016-01-19 NOTE — ED Triage Notes (Signed)
Patient c/o dysuria and blood in urine x 2 days.  Patient also c/o SOB x couple days. Patient denies cough.
